# Patient Record
Sex: Female | Born: 1935 | Race: White | Hispanic: No | Smoking: Never smoker
Health system: Southern US, Community
[De-identification: ages and names within clinical notes are randomized; demographics above are authoritative.]

## PROBLEM LIST (undated history)

## (undated) DIAGNOSIS — I82409 Acute embolism and thrombosis of unspecified deep veins of unspecified lower extremity: Secondary | ICD-10-CM

## (undated) DIAGNOSIS — I872 Venous insufficiency (chronic) (peripheral): Secondary | ICD-10-CM

## (undated) DIAGNOSIS — N200 Calculus of kidney: Secondary | ICD-10-CM

## (undated) DIAGNOSIS — M1990 Unspecified osteoarthritis, unspecified site: Secondary | ICD-10-CM

## (undated) DIAGNOSIS — I10 Essential (primary) hypertension: Secondary | ICD-10-CM

## (undated) DIAGNOSIS — E785 Hyperlipidemia, unspecified: Secondary | ICD-10-CM

## (undated) DIAGNOSIS — E669 Obesity, unspecified: Secondary | ICD-10-CM

## (undated) HISTORY — PX: FOOT SURGERY: SHX648

## (undated) HISTORY — DX: Venous insufficiency (chronic) (peripheral): I87.2

## (undated) HISTORY — PX: JOINT REPLACEMENT: SHX530

## (undated) HISTORY — PX: APPENDECTOMY: SHX54

## (undated) HISTORY — DX: Essential (primary) hypertension: I10

## (undated) HISTORY — PX: ABDOMINAL HYSTERECTOMY: SHX81

## (undated) HISTORY — DX: Acute embolism and thrombosis of unspecified deep veins of unspecified lower extremity: I82.409

## (undated) HISTORY — DX: Unspecified osteoarthritis, unspecified site: M19.90

## (undated) HISTORY — PX: VARICOSE VEIN SURGERY: SHX832

## (undated) HISTORY — DX: Hyperlipidemia, unspecified: E78.5

## (undated) HISTORY — DX: Obesity, unspecified: E66.9

---

## 1999-07-12 ENCOUNTER — Encounter: Payer: Self-pay | Admitting: Orthopaedic Surgery

## 1999-07-18 ENCOUNTER — Inpatient Hospital Stay (HOSPITAL_COMMUNITY): Admission: RE | Admit: 1999-07-18 | Discharge: 1999-07-21 | Payer: Self-pay | Admitting: Orthopaedic Surgery

## 1999-07-21 ENCOUNTER — Inpatient Hospital Stay (HOSPITAL_COMMUNITY)
Admission: RE | Admit: 1999-07-21 | Discharge: 1999-07-28 | Payer: Self-pay | Admitting: Physical Medicine & Rehabilitation

## 1999-08-17 ENCOUNTER — Encounter: Admission: RE | Admit: 1999-08-17 | Discharge: 1999-11-15 | Payer: Self-pay | Admitting: Orthopaedic Surgery

## 1999-10-10 ENCOUNTER — Ambulatory Visit (HOSPITAL_BASED_OUTPATIENT_CLINIC_OR_DEPARTMENT_OTHER): Admission: RE | Admit: 1999-10-10 | Discharge: 1999-10-10 | Payer: Self-pay | Admitting: Orthopaedic Surgery

## 1999-11-20 ENCOUNTER — Encounter: Admission: RE | Admit: 1999-11-20 | Discharge: 1999-12-21 | Payer: Self-pay | Admitting: Orthopaedic Surgery

## 2003-06-27 ENCOUNTER — Emergency Department (HOSPITAL_COMMUNITY): Admission: EM | Admit: 2003-06-27 | Discharge: 2003-06-27 | Payer: Self-pay | Admitting: Emergency Medicine

## 2004-01-11 ENCOUNTER — Ambulatory Visit (HOSPITAL_COMMUNITY): Admission: RE | Admit: 2004-01-11 | Discharge: 2004-01-11 | Payer: Self-pay | Admitting: Internal Medicine

## 2004-06-05 ENCOUNTER — Ambulatory Visit: Payer: Self-pay | Admitting: Internal Medicine

## 2004-11-16 ENCOUNTER — Ambulatory Visit: Payer: Self-pay | Admitting: Internal Medicine

## 2005-01-15 ENCOUNTER — Ambulatory Visit: Payer: Self-pay | Admitting: Family Medicine

## 2005-01-17 ENCOUNTER — Ambulatory Visit: Payer: Self-pay

## 2005-03-19 ENCOUNTER — Ambulatory Visit: Payer: Self-pay | Admitting: Internal Medicine

## 2005-03-25 ENCOUNTER — Encounter: Admission: RE | Admit: 2005-03-25 | Discharge: 2005-03-25 | Payer: Self-pay | Admitting: Internal Medicine

## 2005-05-02 ENCOUNTER — Encounter: Admission: RE | Admit: 2005-05-02 | Discharge: 2005-05-02 | Payer: Self-pay | Admitting: Orthopedic Surgery

## 2005-05-02 IMAGING — CR DG MYELOGRAM LUMBAR
3 series · 3 of 3 positions shown · IV contrast (omnipaque)
Comparison: none

CLINICAL DATA: Back and left leg pain.  
LUMBAR MYELOGRAM:
Following informed consent, sterile preparation of the back, and adequate local anesthesia, a lumbar puncture was performed using a 22 gauge spinal needle at 
L2-3 from a left/right paramedian approach.  Fluid was clear and colorless.  15 cc of Omnipaque 180 was instilled in the subarachnoid space.  AP, lateral, and oblique views demonstrate advanced disk space narrowing at L1-2.  Moderate stenosis at L3-4 and L4-5 with effacement of the exiting nerve roots is noted at these levels.  Flexion/extension show no abnormal movement.  There is mild anterolisthesis of L4 forward on L5 which is felt to be degenerative in nature.   Ventral defect at  L3-4 appears to be related to disk material.  
POST MYELOGRAM CT OF THE LUMBAR SPINE:
TECHNIQUE: Multidetector CT imaging of the lumbar spine was performed after intrathecal injection of contrast.  Multiplanar CT image reconstructions were also generated.

[view not recorded (1 of 3)]
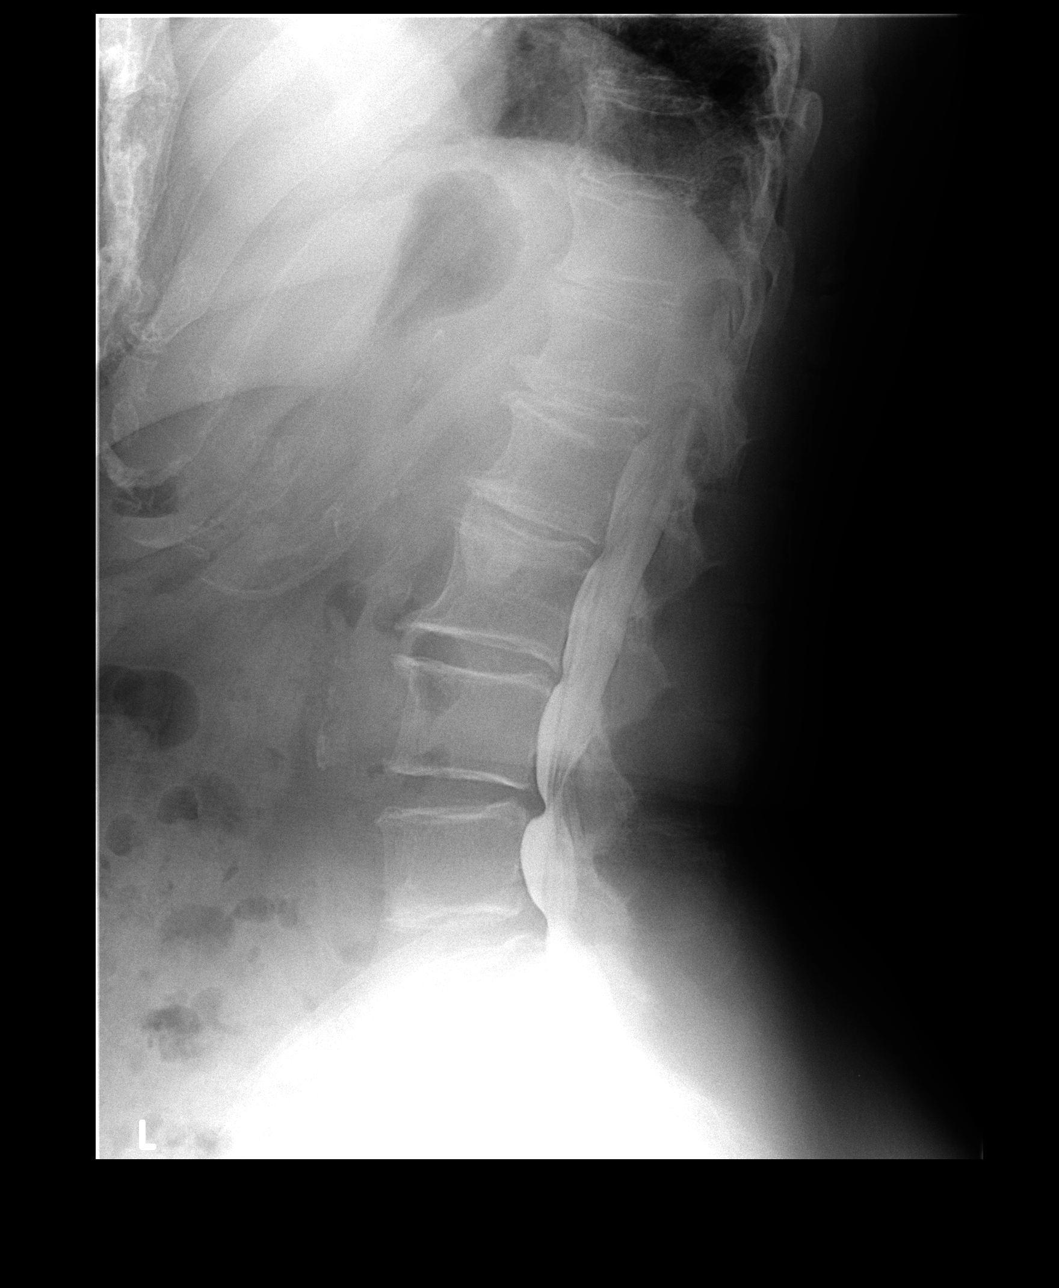

[view not recorded (2 of 3)]
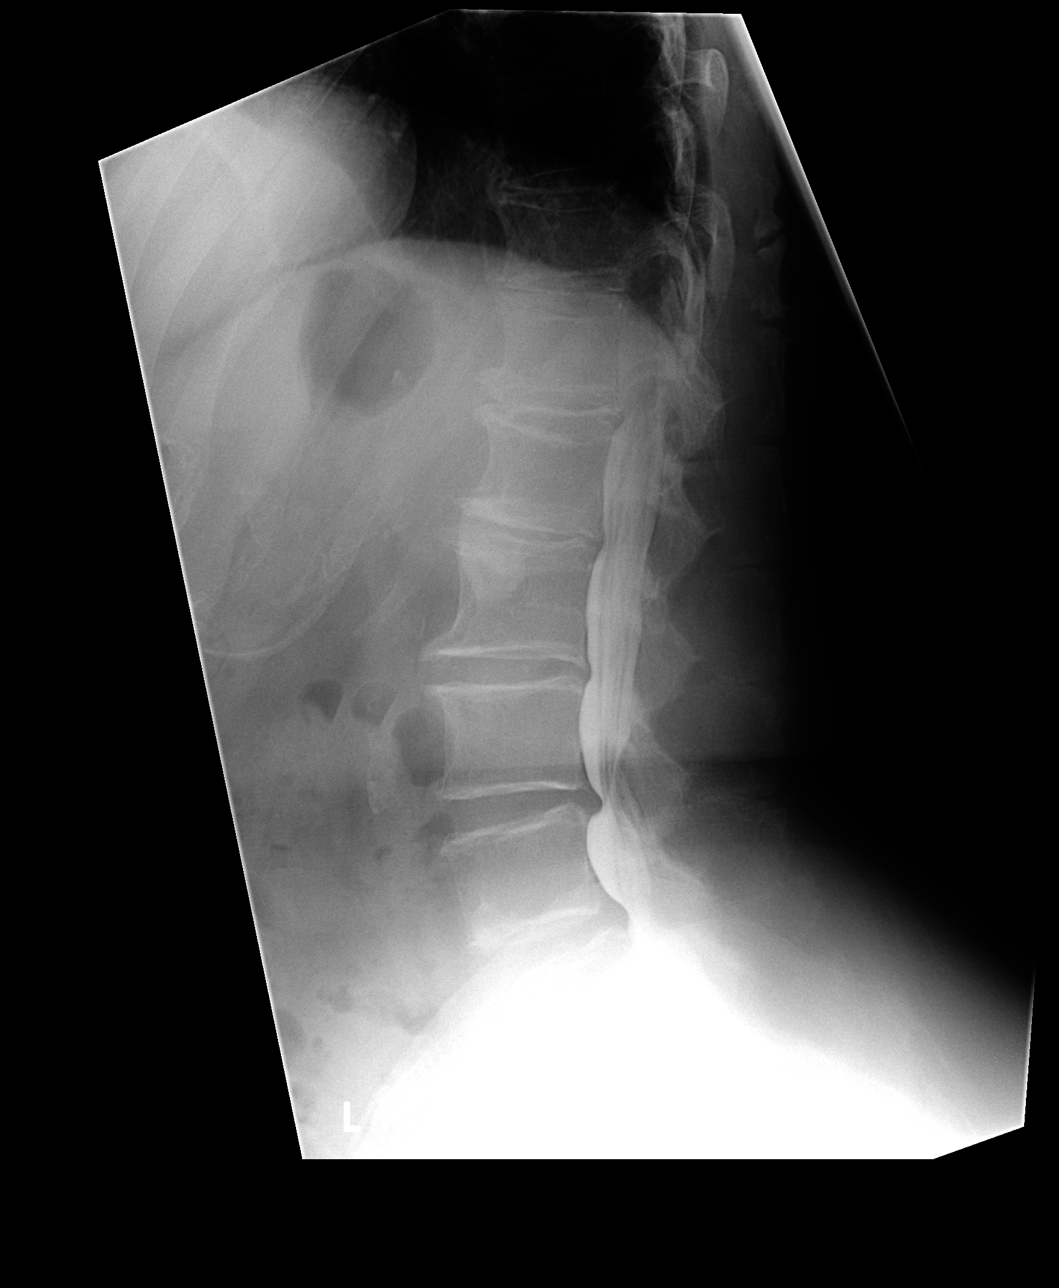

[view not recorded (3 of 3)]
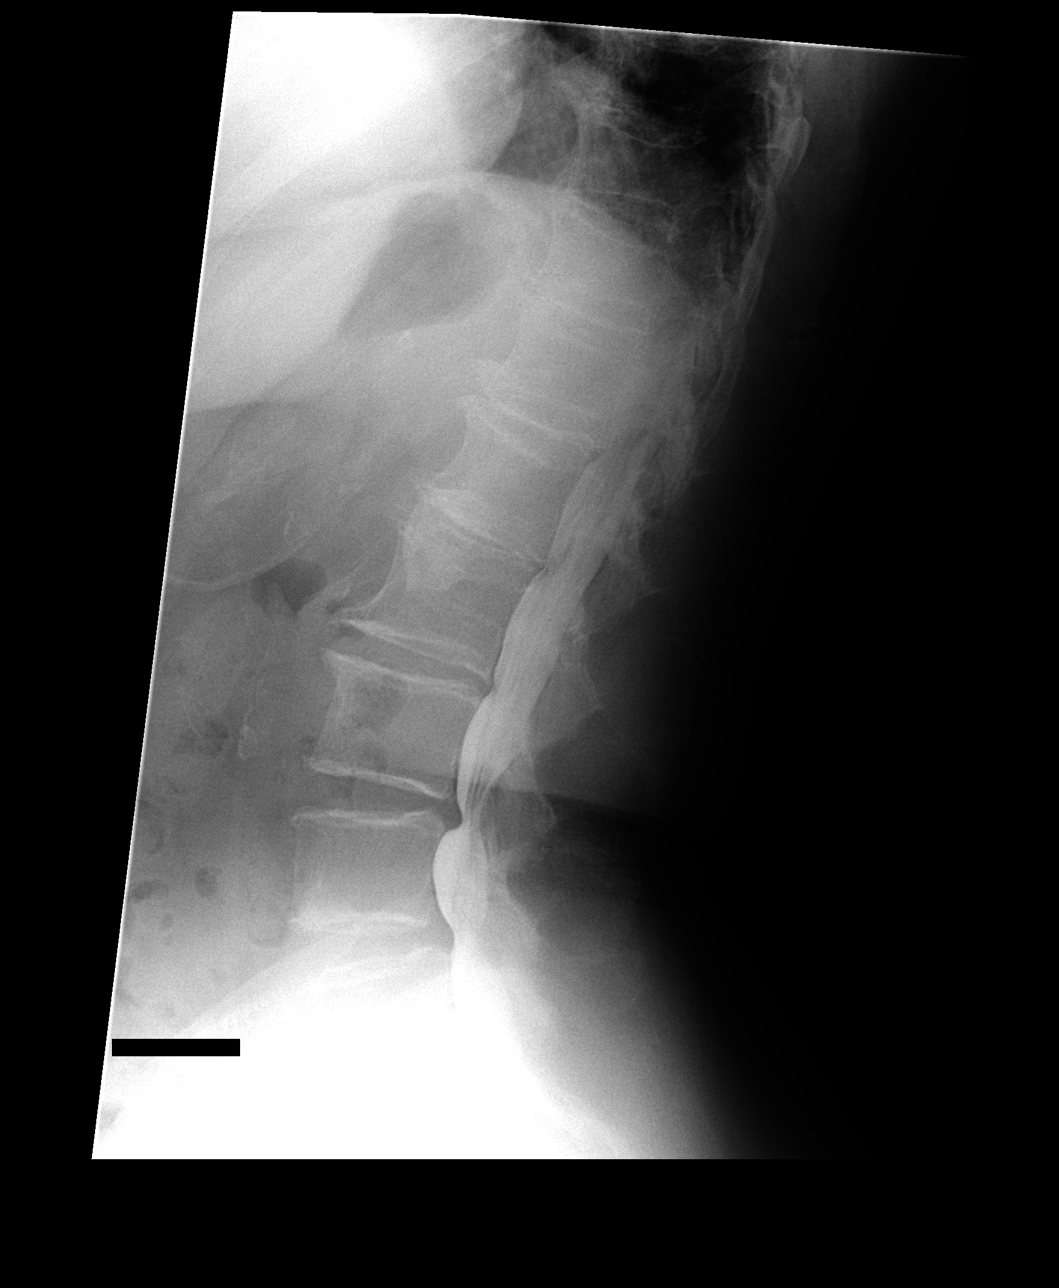

[3 of 3 positions shown; findings below may reference images not displayed]

FINDINGS: L1-2:  Large anterior osteophytes with moderate disk space narrowing.  No stenosis or disk protrusion.
L2-3:  Anterior osteophyte formation.  No stenosis or disk protrusion.
L3-4:  Significant stenosis secondary to a broad-based central protrusion and posterior element hypertrophy.  There is bilateral L4 nerve root encroachment along with compression of the thecal sac.  There is a significant component of disk material in the foramen which is compromising the left L3 root as well. 
L4-5:  Significant spinal stenosis due to posterior element hypertrophy and broad-based central disk protrusion.  Mild degenerative slip noted on plain films has reduced partially on CT.  Bilateral L5 nerve root encroachment is present with compression of the thecal sac.  There is mild left L4 nerve root encroachment in the foramen which is multifactorial due to bony overgrowth, disk material and slip. 
L5-S1: Anterior osteophyte formation.  No stenosis or disk protrusion.  Mild to moderate facet arthropathy particularly on the right. 
Vascular calcification is seen in the aorta, but there is no aneurysmal dilatation.
IMPRESSION: 1.  Significant spinal stenosis at L3-4 secondary to posterior element hypertrophy and a broad-based disk protrusion; bilateral L4 nerve root encroachment is present in the canal, left greater than right along a significant foraminal disk on the left compressing the left L3 nerve root. 
2.  Multifactorial spinal stenosis at L4-5 due to posterior element hypertrophy, broad-based disk protrusion and mild degenerative slip; bilateral L5 nerve root encroachment is seen along with mild left L4 nerve root compromise. 
3.  Mild facet disease L5-S1 without significant nerve root encroachment or spinal stenosis.

## 2005-06-20 ENCOUNTER — Ambulatory Visit: Payer: Self-pay | Admitting: Internal Medicine

## 2005-09-19 ENCOUNTER — Ambulatory Visit: Payer: Self-pay | Admitting: Internal Medicine

## 2005-12-15 ENCOUNTER — Ambulatory Visit: Payer: Self-pay | Admitting: Family Medicine

## 2005-12-24 ENCOUNTER — Ambulatory Visit: Payer: Self-pay | Admitting: Internal Medicine

## 2005-12-31 ENCOUNTER — Encounter: Payer: Self-pay | Admitting: Internal Medicine

## 2006-04-01 ENCOUNTER — Encounter: Payer: Self-pay | Admitting: Internal Medicine

## 2006-04-02 ENCOUNTER — Ambulatory Visit: Payer: Self-pay | Admitting: Internal Medicine

## 2006-04-02 LAB — CONVERTED CEMR LAB
ALT: 18 units/L (ref 0–40)
AST: 25 units/L (ref 0–37)
Albumin: 4.3 g/dL (ref 3.5–5.2)
Alkaline Phosphatase: 75 units/L (ref 39–117)
BUN: 10 mg/dL (ref 6–23)
Basophils Absolute: 0 10*3/uL (ref 0.0–0.1)
Basophils Relative: 0.7 % (ref 0.0–1.0)
Bilirubin, Direct: 0.2 mg/dL (ref 0.0–0.3)
CO2: 27 meq/L (ref 19–32)
Calcium: 9.4 mg/dL (ref 8.4–10.5)
Chloride: 101 meq/L (ref 96–112)
Chol/HDL Ratio, serum: 4.6
Cholesterol: 272 mg/dL (ref 0–200)
Creatinine, Ser: 0.9 mg/dL (ref 0.4–1.2)
Eosinophil percent: 3.6 % (ref 0.0–5.0)
GFR calc non Af Amer: 64 mL/min
Glomerular Filtration Rate, Af Am: 77 mL/min/{1.73_m2}
Glucose, Bld: 103 mg/dL — ABNORMAL HIGH (ref 70–99)
HCT: 40 % (ref 36.0–46.0)
HDL: 59.1 mg/dL (ref >39.0)
Hemoglobin: 13.5 g/dL (ref 12.0–15.0)
LDL DIRECT: 190.9 mg/dL
Lymphocytes Relative: 25.3 % (ref 12.0–46.0)
MCHC: 33.9 g/dL (ref 30.0–36.0)
MCV: 85.9 fL (ref 78.0–100.0)
Monocytes Absolute: 0.5 10*3/uL (ref 0.2–0.7)
Monocytes Relative: 8 % (ref 3.0–11.0)
Neutro Abs: 4 10*3/uL (ref 1.4–7.7)
Neutrophils Relative %: 62.4 % (ref 43.0–77.0)
Platelets: 286 10*3/uL (ref 150–400)
Potassium: 3.7 meq/L (ref 3.5–5.1)
RBC: 4.65 M/uL (ref 3.87–5.11)
RDW: 13.7 % (ref 11.5–14.6)
Sodium: 141 meq/L (ref 135–145)
TSH: 1.34 microintl units/mL (ref 0.35–5.50)
Total Bilirubin: 0.9 mg/dL (ref 0.3–1.2)
Total Protein: 6.9 g/dL (ref 6.0–8.3)
Triglyceride fasting, serum: 118 mg/dL (ref 0–149)
VLDL: 24 mg/dL (ref 0–40)
WBC: 6.3 10*3/uL (ref 4.5–10.5)

## 2006-09-23 ENCOUNTER — Ambulatory Visit: Payer: Self-pay | Admitting: Internal Medicine

## 2007-02-13 ENCOUNTER — Encounter: Payer: Self-pay | Admitting: Internal Medicine

## 2007-02-13 DIAGNOSIS — I10 Essential (primary) hypertension: Secondary | ICD-10-CM | POA: Insufficient documentation

## 2007-02-13 DIAGNOSIS — E785 Hyperlipidemia, unspecified: Secondary | ICD-10-CM

## 2007-02-13 DIAGNOSIS — M1990 Unspecified osteoarthritis, unspecified site: Secondary | ICD-10-CM

## 2007-02-13 HISTORY — DX: Essential (primary) hypertension: I10

## 2007-02-13 HISTORY — DX: Unspecified osteoarthritis, unspecified site: M19.90

## 2007-02-13 HISTORY — DX: Hyperlipidemia, unspecified: E78.5

## 2007-04-03 ENCOUNTER — Ambulatory Visit: Payer: Self-pay | Admitting: Internal Medicine

## 2007-08-14 ENCOUNTER — Encounter: Payer: Self-pay | Admitting: Internal Medicine

## 2007-09-18 ENCOUNTER — Ambulatory Visit: Payer: Self-pay | Admitting: Internal Medicine

## 2007-09-18 DIAGNOSIS — M65849 Other synovitis and tenosynovitis, unspecified hand: Secondary | ICD-10-CM

## 2007-09-18 DIAGNOSIS — M65839 Other synovitis and tenosynovitis, unspecified forearm: Secondary | ICD-10-CM | POA: Insufficient documentation

## 2007-12-16 ENCOUNTER — Ambulatory Visit: Payer: Self-pay | Admitting: Internal Medicine

## 2008-04-14 ENCOUNTER — Encounter: Payer: Self-pay | Admitting: Internal Medicine

## 2008-04-15 ENCOUNTER — Ambulatory Visit: Payer: Self-pay | Admitting: Internal Medicine

## 2008-04-15 LAB — CONVERTED CEMR LAB
Basophils Absolute: 0 10*3/uL (ref 0.0–0.1)
Bilirubin, Direct: 0.1 mg/dL (ref 0.0–0.3)
Calcium: 9.4 mg/dL (ref 8.4–10.5)
Cholesterol: 245 mg/dL (ref 0–200)
GFR calc Af Amer: 77 mL/min
Glucose, Bld: 105 mg/dL — ABNORMAL HIGH (ref 70–99)
HCT: 39.6 % (ref 36.0–46.0)
Hemoglobin: 13.7 g/dL (ref 12.0–15.0)
MCHC: 34.7 g/dL (ref 30.0–36.0)
Monocytes Absolute: 0.5 10*3/uL (ref 0.1–1.0)
Neutro Abs: 4.1 10*3/uL (ref 1.4–7.7)
RDW: 13.9 % (ref 11.5–14.6)
Sodium: 142 meq/L (ref 135–145)
TSH: 1.69 microintl units/mL (ref 0.35–5.50)
Total Bilirubin: 0.8 mg/dL (ref 0.3–1.2)
Total Protein: 7.2 g/dL (ref 6.0–8.3)

## 2008-09-20 ENCOUNTER — Ambulatory Visit: Payer: Self-pay | Admitting: Internal Medicine

## 2009-04-18 ENCOUNTER — Ambulatory Visit: Payer: Self-pay | Admitting: Internal Medicine

## 2009-08-30 ENCOUNTER — Ambulatory Visit: Payer: Self-pay | Admitting: Internal Medicine

## 2009-09-07 ENCOUNTER — Encounter: Payer: Self-pay | Admitting: Internal Medicine

## 2009-09-12 ENCOUNTER — Encounter: Admission: RE | Admit: 2009-09-12 | Discharge: 2009-09-12 | Payer: Self-pay | Admitting: Orthopaedic Surgery

## 2009-10-17 ENCOUNTER — Ambulatory Visit: Payer: Self-pay | Admitting: Internal Medicine

## 2010-04-18 ENCOUNTER — Encounter: Payer: Self-pay | Admitting: Internal Medicine

## 2010-04-18 ENCOUNTER — Ambulatory Visit: Payer: Self-pay | Admitting: Internal Medicine

## 2010-04-18 LAB — CONVERTED CEMR LAB
ALT: 16 units/L (ref 0–35)
BUN: 12 mg/dL (ref 6–23)
Basophils Absolute: 0 10*3/uL (ref 0.0–0.1)
Bilirubin, Direct: 0.2 mg/dL (ref 0.0–0.3)
Cholesterol: 252 mg/dL — ABNORMAL HIGH (ref 0–200)
Creatinine, Ser: 0.9 mg/dL (ref 0.4–1.2)
Direct LDL: 166 mg/dL
Eosinophils Relative: 3.4 % (ref 0.0–5.0)
GFR calc non Af Amer: 59.85 mL/min (ref >60)
Glucose, Bld: 109 mg/dL — ABNORMAL HIGH (ref 70–99)
HDL: 66 mg/dL (ref >39.00)
Monocytes Absolute: 0.7 10*3/uL (ref 0.1–1.0)
Monocytes Relative: 10.6 % (ref 3.0–12.0)
Neutrophils Relative %: 61.4 % (ref 43.0–77.0)
Platelets: 229 10*3/uL (ref 150.0–400.0)
RDW: 16.1 % — ABNORMAL HIGH (ref 11.5–14.6)
TSH: 1.24 microintl units/mL (ref 0.35–5.50)
Total Bilirubin: 1.2 mg/dL (ref 0.3–1.2)
Triglycerides: 128 mg/dL (ref 0.0–149.0)
VLDL: 25.6 mg/dL (ref 0.0–40.0)
WBC: 6.9 10*3/uL (ref 4.5–10.5)

## 2010-07-16 LAB — CONVERTED CEMR LAB
ALT: 17 units/L (ref 0–35)
AST: 24 units/L (ref 0–37)
Albumin: 4.2 g/dL (ref 3.5–5.2)
Alkaline Phosphatase: 75 units/L (ref 39–117)
BUN: 10 mg/dL (ref 6–23)
BUN: 9 mg/dL (ref 6–23)
Basophils Absolute: 0 10*3/uL (ref 0.0–0.1)
Basophils Relative: 0.5 % (ref 0.0–1.0)
Bilirubin, Direct: 0.1 mg/dL (ref 0.0–0.3)
Bilirubin, Direct: 0.3 mg/dL (ref 0.0–0.3)
CO2: 29 meq/L (ref 19–32)
Calcium: 9.4 mg/dL (ref 8.4–10.5)
Chloride: 108 meq/L (ref 96–112)
Chloride: 99 meq/L (ref 96–112)
Cholesterol: 236 mg/dL — ABNORMAL HIGH (ref 0–200)
Cholesterol: 260 mg/dL (ref 0–200)
Creatinine, Ser: 0.9 mg/dL (ref 0.4–1.2)
Creatinine, Ser: 0.9 mg/dL (ref 0.4–1.2)
Direct LDL: 171.3 mg/dL
Direct LDL: 179.2 mg/dL
Eosinophils Absolute: 0.3 10*3/uL (ref 0.0–0.7)
Eosinophils Relative: 4.5 % (ref 0.0–5.0)
Eosinophils Relative: 4.7 % (ref 0.0–5.0)
GFR calc Af Amer: 77 mL/min
GFR calc non Af Amer: 63 mL/min
Glucose, Bld: 106 mg/dL — ABNORMAL HIGH (ref 70–99)
HCT: 39 % (ref 36.0–46.0)
HDL: 57 mg/dL (ref >39.0)
Hemoglobin: 13.4 g/dL (ref 12.0–15.0)
Lymphocytes Relative: 27.1 % (ref 12.0–46.0)
MCHC: 34.5 g/dL (ref 30.0–36.0)
MCHC: 34.8 g/dL (ref 30.0–36.0)
MCV: 87.6 fL (ref 78.0–100.0)
MCV: 93.5 fL (ref 78.0–100.0)
Monocytes Absolute: 0.5 10*3/uL (ref 0.1–1.0)
Monocytes Relative: 6.9 % (ref 3.0–11.0)
Neutrophils Relative %: 60.8 % (ref 43.0–77.0)
Neutrophils Relative %: 69.8 % (ref 43.0–77.0)
Platelets: 222 10*3/uL (ref 150.0–400.0)
Platelets: 234 10*3/uL (ref 150–400)
Potassium: 4 meq/L (ref 3.5–5.1)
RBC: 4.45 M/uL (ref 3.87–5.11)
RDW: 14.1 % (ref 11.5–14.6)
RDW: 14.9 % — ABNORMAL HIGH (ref 11.5–14.6)
Sodium: 143 meq/L (ref 135–145)
Total Bilirubin: 1 mg/dL (ref 0.3–1.2)
Total Bilirubin: 1.2 mg/dL (ref 0.3–1.2)
Total CHOL/HDL Ratio: 4.6
Total Protein: 6.6 g/dL (ref 6.0–8.3)
Triglycerides: 127 mg/dL (ref 0–149)
VLDL: 20 mg/dL (ref 0.0–40.0)
VLDL: 25 mg/dL (ref 0–40)
WBC: 6.2 10*3/uL (ref 4.5–10.5)
WBC: 7 10*3/uL (ref 4.5–10.5)

## 2010-07-20 NOTE — Assessment & Plan Note (Signed)
Summary: CPX//SLM   Vital Signs:  Patient profile:   77 year old female Height:      63 inches Weight:      192 pounds Temp:     97.5 degrees F oral BP sitting:   180 / 86  (left arm) Cuff size:   regular  Vitals Entered By: Sid Falcon LPN (April 18, 2010 8:56 AM)  History of Present Illness: 77 year old patient who is seen today for a wellness exam.  She has a history of osteoarthritis and chronic low back pain.  She benefited greatly by some epidurals earlier this year.  She has treated hypertension and mild dyslipidemia.  She denies any cardiopulmonary complaints.  Her back pain is dramatically improved and she feels she does quite well at 89.  Here for Medicare AWV:  1.   Risk factors based on Past M, S, F history:   cardiovascular risk factors include age, hypertension, and dyslipidemia 2.   Physical Activities:  fairly sedentary due to age and arthritis 3.   Depression/mood: no history of depression or mood disorder 4.   Hearing: moderately impaired.  Uses hearing aids bilaterally 5.   ADL's: independent in all aspects of daily living 6.   Fall Risk: moderate due to age and arthritis 7.   Home Safety: no proms identifying 8.   Height, weight, &visual acuity:height weight, and visual acuity, stable.  Did have a recent eye exam yesterday 9.   Counseling: heart healthy diet and restricted salt diet discussed and encouraged 10.   Labs ordered based on risk factors: laboratory  profile reviewed 11.           Referral Coordination- not appropriate at this time.  Follow up orthopedic as needed 12.           Care Plan- laboratory panel be reviewed.  Calcium and vitamin D supplementation.  Encouraged 13.            Cognitive Assessment- alert and oriented, with normal affect.  No history of memory dysfunction.  Continues to handle all executive functions without difficulty or assistance   Allergies: 1)  * Flu Vaccine 2)  Amoxicillin (Amoxicillin) 3)  Carbamazepine  (Carbamazepine) 4)  Keflex (Cephalexin)  Past History:  Past Medical History: Reviewed history from 04/15/2008 and no changes required. Hypertension Osteoarthritis Chronic venous insufficiency Hyperlipidemia exogenous obesity remote history of DVT  Past Surgical History: Appendectomy Hysterectomy  80 Vein stripping  52   Foot surgery  89 status post left total knee replacement 2001   flexible sigmoidoscopy in 1990  Family History: Reviewed history from 04/15/2008 and no changes required. at age 43, coronary artery disease mother died at age 11 3 brothers, all deceased, positive for CAD, COPD, renal failure two sisters, both deceased  renal failure, CAD, one  with polycystic kidney disease  Social History: Reviewed history from 04/15/2008 and no changes required. Widow/Widower Never Smoked  Review of Systems       The patient complains of difficulty walking.  The patient denies anorexia, fever, weight loss, weight gain, vision loss, decreased hearing, hoarseness, chest pain, syncope, dyspnea on exertion, peripheral edema, prolonged cough, headaches, hemoptysis, abdominal pain, melena, hematochezia, severe indigestion/heartburn, hematuria, incontinence, genital sores, muscle weakness, suspicious skin lesions, transient blindness, depression, unusual weight change, abnormal bleeding, enlarged lymph nodes, angioedema, and breast masses.    Physical Exam  General:  overweight-appearing.  150/64overweight-appearing.   Head:  Normocephalic and atraumatic without obvious abnormalities. No apparent alopecia or balding. Eyes:  No corneal or conjunctival inflammation noted. EOMI. Perrla. Funduscopic exam benign, without hemorrhages, exudates or papilledema. Vision grossly normal. Ears:  External ear exam shows no significant lesions or deformities.  Otoscopic examination reveals clear canals, tympanic membranes are intact bilaterally without bulging, retraction, inflammation or  discharge. Hearing is grossly normal bilaterally. Nose:  External nasal examination shows no deformity or inflammation. Nasal mucosa are pink and moist without lesions or exudates. Mouth:  Oral mucosa and oropharynx without lesions or exudates.  Teeth in good repair. Neck:  No deformities, masses, or tenderness noted. Chest Wall:  No deformities, masses, or tenderness noted. Breasts:  No mass, nodules, thickening, tenderness, bulging, retraction, inflamation, nipple discharge or skin changes noted.   Lungs:  Normal respiratory effort, chest expands symmetrically. Lungs are clear to auscultation, no crackles or wheezes. Heart:  Normal rate and regular rhythm. S1 and S2 normal without gallop, murmur, click, rub or other extra sounds. Abdomen:  Bowel sounds positive,abdomen soft and non-tender without masses, organomegaly or hernias noted. Rectal:  No external abnormalities noted. Normal sphincter tone. No rectal masses or tenderness. Msk:  No deformity or scoliosis noted of thoracic or lumbar spine.   Pulses:  posterior tibial pulses intact Extremities:  No clubbing, cyanosis, edema, or deformity noted with normal full range of motion of all joints.   Neurologic:  No cranial nerve deficits noted. Station and gait are normal. Plantar reflexes are down-going bilaterally. DTRs are symmetrical throughout. Sensory, motor and coordinative functions appear intact. Skin:  Intact without suspicious lesions or rashes Cervical Nodes:  No lymphadenopathy noted Axillary Nodes:  No palpable lymphadenopathy Inguinal Nodes:  No significant adenopathy Psych:  Cognition and judgment appear intact. Alert and cooperative with normal attention span and concentration. No apparent delusions, illusions, hallucinations   Impression & Recommendations:  Problem # 1:  PREVENTIVE HEALTH CARE (ICD-V70.0)  Orders: Medicare -1st Annual Wellness Visit [id number])  Complete Medication List: 1)  Hydrochlorothiazide 25 Mg Tabs  (Hydrochlorothiazide) .... Take 1 tablet by mouth every morning 2)  Hydrocodone-acetaminophen 5-500 Mg Tabs (Hydrocodone-acetaminophen) .... One every 6 hours for pain 3)  Felodipine 10 Mg Xr24h-tab (Felodipine) .Marland Kitchen.. 1 once daily  Other Orders: Venipuncture (60454) TLB-Lipid Panel (80061-LIPID) TLB-BMP (Basic Metabolic Panel-BMET) (80048-METABOL) TLB-CBC Platelet - w/Differential (85025-CBCD) TLB-Hepatic/Liver Function Pnl (80076-HEPATIC) TLB-TSH (Thyroid Stimulating Hormone) (84443-TSH) Specimen Handling (09811)  Patient Instructions: 1)  Please schedule a follow-up appointment in 6 months. 2)  Limit your Sodium (Salt). 3)  It is important that you exercise regularly at least 20 minutes 5 times a week. If you develop chest pain, have severe difficulty breathing, or feel very tired , stop exercising immediately and seek medical attention. 4)  Take calcium +Vitamin D daily. Prescriptions: FELODIPINE 10 MG XR24H-TAB (FELODIPINE) 1 once daily  #90 Tablet x 3   Entered and Authorized by:   Gordy Savers  MD   Signed by:   Gordy Savers  MD on 04/18/2010   Method used:   Print then Give to Patient   RxID:   [id number] HYDROCODONE-ACETAMINOPHEN 5-500 MG  TABS (HYDROCODONE-ACETAMINOPHEN) one every 6 hours for pain  #90 x 3   Entered and Authorized by:   Gordy Savers  MD   Signed by:   Gordy Savers  MD on 04/18/2010   Method used:   Print then Give to Patient   RxID:   [id number] HYDROCHLOROTHIAZIDE 25 MG TABS (HYDROCHLOROTHIAZIDE) Take 1 tablet by mouth every morning  #90 Tablet x 3   Entered  and Authorized by:   Gordy Savers  MD   Signed by:   Gordy Savers  MD on 04/18/2010   Method used:   Print then Give to Patient   RxID:   [id number]    Orders Added: 1)  Medicare -1st Annual Wellness Visit [G0438] 2)  Est. Patient Level III [14782] 3)  Venipuncture [95621] 4)  TLB-Lipid Panel [80061-LIPID] 5)  TLB-BMP (Basic Metabolic  Panel-BMET) [80048-METABOL] 6)  TLB-CBC Platelet - w/Differential [85025-CBCD] 7)  TLB-Hepatic/Liver Function Pnl [80076-HEPATIC] 8)  TLB-TSH (Thyroid Stimulating Hormone) [84443-TSH] 9)  Specimen Handling [99000]

## 2010-07-20 NOTE — Assessment & Plan Note (Signed)
Summary: 6 MONTH ROV/NJR   Vital Signs:  Patient profile:   77 year old female Weight:      187 pounds Temp:     97.8 degrees F oral BP sitting:   130 / 70  (right arm) Cuff size:   regular  Vitals Entered By: Duard Brady LPN (Oct 17, 1608 8:17 AM) CC: 6 mos rov - doing well Is Patient Diabetic? No   CC:  6 mos rov - doing well.  History of Present Illness: 77 year old patient who is seen today for follow-up of her hypertension.  She is followed by orthopedic surgery for arthritis she is doing fairly well and has improved with some local injection therapy.  She has a history of mild dyslipidemia.  She denies any cardiopulmonary complaints.  She has hypertension, which is controlled on dual therapy, which she continues to tolerate well.  She denies any cardiopulmonary complaints.  Preventive Screening-Counseling & Management  Alcohol-Tobacco     Smoking Status: never  Allergies: 1)  * Flu Vaccine 2)  Amoxicillin (Amoxicillin) 3)  Carbamazepine (Carbamazepine) 4)  Keflex (Cephalexin)  Past History:  Past Medical History: Reviewed history from 04/15/2008 and no changes required. Hypertension Osteoarthritis Chronic venous insufficiency Hyperlipidemia exogenous obesity remote history of DVT  Past Surgical History: Reviewed history from 08/30/2009 and no changes required. Appendectomy Hysterectomy  80 Vein stripping  52   Foot surgery  89 status post left total knee replacement 2001 (  flexible sigmoidoscopy in 1990  Review of Systems       The patient complains of difficulty walking.  The patient denies anorexia, fever, weight loss, weight gain, vision loss, decreased hearing, hoarseness, chest pain, syncope, dyspnea on exertion, peripheral edema, prolonged cough, headaches, hemoptysis, abdominal pain, melena, hematochezia, severe indigestion/heartburn, hematuria, incontinence, genital sores, muscle weakness, suspicious skin lesions, transient blindness,  depression, unusual weight change, abnormal bleeding, enlarged lymph nodes, angioedema, and breast masses.    Physical Exam  General:  overweight-appearing.  130/78 Head:  Normocephalic and atraumatic without obvious abnormalities. No apparent alopecia or balding. Eyes:  No corneal or conjunctival inflammation noted. EOMI. Perrla. Funduscopic exam benign, without hemorrhages, exudates or papilledema. Vision grossly normal. Mouth:  Oral mucosa and oropharynx without lesions or exudates.  Teeth in good repair. Neck:  No deformities, masses, or tenderness noted. Lungs:  Normal respiratory effort, chest expands symmetrically. Lungs are clear to auscultation, no crackles or wheezes. Heart:  Normal rate and regular rhythm. S1 and S2 normal without gallop, murmur, click, rub or other extra sounds. Abdomen:  Bowel sounds positive,abdomen soft and non-tender without masses, organomegaly or hernias noted.   Impression & Recommendations:  Problem # 1:  OSTEOARTHRITIS (ICD-715.90)  Her updated medication list for this problem includes:    Hydrocodone-acetaminophen 5-500 Mg Tabs (Hydrocodone-acetaminophen) ..... One every 6 hours for pain  Her updated medication list for this problem includes:    Hydrocodone-acetaminophen 5-500 Mg Tabs (Hydrocodone-acetaminophen) ..... One every 6 hours for pain  Problem # 2:  HYPERTENSION (ICD-401.9)  Her updated medication list for this problem includes:    Hydrochlorothiazide 25 Mg Tabs (Hydrochlorothiazide) .Marland Kitchen... Take 1 tablet by mouth every morning    Felodipine 10 Mg Xr24h-tab (Felodipine) .Marland Kitchen... 1 once daily  Her updated medication list for this problem includes:    Hydrochlorothiazide 25 Mg Tabs (Hydrochlorothiazide) .Marland Kitchen... Take 1 tablet by mouth every morning    Felodipine 10 Mg Xr24h-tab (Felodipine) .Marland Kitchen... 1 once daily  Complete Medication List: 1)  Hydrochlorothiazide 25 Mg Tabs (  Hydrochlorothiazide) .... Take 1 tablet by mouth every morning 2)   Hydrocodone-acetaminophen 5-500 Mg Tabs (Hydrocodone-acetaminophen) .... One every 6 hours for pain 3)  Felodipine 10 Mg Xr24h-tab (Felodipine) .Marland Kitchen.. 1 once daily  Patient Instructions: 1)  Please schedule a follow-up appointment in 6 months. 2)  Limit your Sodium (Salt) to less than 2 grams a day(slightly less than 1/2 a teaspoon) to prevent fluid retention, swelling, or worsening of symptoms. 3)  It is important that you exercise regularly at least 20 minutes 5 times a week. If you develop chest pain, have severe difficulty breathing, or feel very tired , stop exercising immediately and seek medical attention. 4)  Take calcium +Vitamin D daily.

## 2010-07-20 NOTE — Consult Note (Signed)
Summary: [facility]  [facility]   Imported By: Maryln Gottron 09/16/2009 15:23:08  _____________________________________________________________________  External Attachment:    Type:   Image     Comment:   External Document

## 2010-07-20 NOTE — Assessment & Plan Note (Signed)
Summary: pain in hip/difficulty walking/cjr   Vital Signs:  Patient profile:   77 year old female Weight:      190 pounds Temp:     97.9 degrees F oral BP sitting:   132 / 70  (right arm) Cuff size:   regular  Vitals Entered By: Duard Brady LPN (August 30, 2009 12:54 PM) CC: c/o right hip pain, no injury/fall Is Patient Diabetic? No   CC:  c/o right hip pain and no injury/fall.  History of Present Illness: an 77 year old patient has a history of osteoarthritis.  She is  status post the knee replacement surgery in 2001.  She has had some chronic right hip pain that has intensified recently.  There is been no history of trauma.  She has been using pain medication on a regular basis and is having much more difficult time with ambulating.  She has treated hypertension, which has been stable  Preventive Screening-Counseling & Management  Alcohol-Tobacco     Smoking Status: never  Allergies: 1)  * Flu Vaccine 2)  Amoxicillin (Amoxicillin) 3)  Carbamazepine (Carbamazepine) 4)  Keflex (Cephalexin)  Past History:  Past Medical History: Reviewed history from 04/15/2008 and no changes required. Hypertension Osteoarthritis Chronic venous insufficiency Hyperlipidemia exogenous obesity remote history of DVT  Past Surgical History: Appendectomy Hysterectomy  80 Vein stripping  52   Foot surgery  89 status post left total knee replacement 2001 (  flexible sigmoidoscopy in 1990  Review of Systems       The patient complains of difficulty walking.  The patient denies anorexia, fever, weight loss, weight gain, vision loss, decreased hearing, hoarseness, chest pain, syncope, dyspnea on exertion, peripheral edema, prolonged cough, headaches, hemoptysis, abdominal pain, melena, hematochezia, severe indigestion/heartburn, hematuria, incontinence, genital sores, muscle weakness, suspicious skin lesions, depression, unusual weight change, abnormal bleeding, enlarged lymph nodes,  angioedema, and breast masses.    Physical Exam  General:  overweight-appearing.  blood pressure 130/74 Head:  Normocephalic and atraumatic without obvious abnormalities. No apparent alopecia or balding. Eyes:  No corneal or conjunctival inflammation noted. EOMI. Perrla. Funduscopic exam benign, without hemorrhages, exudates or papilledema. Vision grossly normal. Mouth:  Oral mucosa and oropharynx without lesions or exudates.  Teeth in good repair. Neck:  No deformities, masses, or tenderness noted. Lungs:  Normal respiratory effort, chest expands symmetrically. Lungs are clear to auscultation, no crackles or wheezes. Heart:  Normal rate and regular rhythm. S1 and S2 normal without gallop, murmur, click, rub or other extra sounds. Abdomen:  Bowel sounds positive,abdomen soft and non-tender without masses, organomegaly or hernias noted. Msk:  No deformity or scoliosis noted of thoracic or lumbar spine.  painful range of motion right hip Extremities:  No clubbing, cyanosis, edema, or deformity noted with normal full range of motion of all joints.     Impression & Recommendations:  Problem # 1:  OSTEOARTHRITIS (ICD-715.90)  Her updated medication list for this problem includes:    Hydrocodone-acetaminophen 5-500 Mg Tabs (Hydrocodone-acetaminophen) ..... One every 6 hours for pain symptomatic right hip pain.  Will schedule orthopedic referral  Her updated medication list for this problem includes:    Hydrocodone-acetaminophen 5-500 Mg Tabs (Hydrocodone-acetaminophen) ..... One every 6 hours for pain  Problem # 2:  HYPERTENSION (ICD-401.9)  Her updated medication list for this problem includes:    Hydrochlorothiazide 25 Mg Tabs (Hydrochlorothiazide) .Marland Kitchen... Take 1 tablet by mouth every morning    Felodipine 10 Mg Xr24h-tab (Felodipine) .Marland Kitchen... 1 once daily  Her updated  medication list for this problem includes:    Hydrochlorothiazide 25 Mg Tabs (Hydrochlorothiazide) .Marland Kitchen... Take 1 tablet by  mouth every morning    Felodipine 10 Mg Xr24h-tab (Felodipine) .Marland Kitchen... 1 once daily  Complete Medication List: 1)  Hydrochlorothiazide 25 Mg Tabs (Hydrochlorothiazide) .... Take 1 tablet by mouth every morning 2)  Hydrocodone-acetaminophen 5-500 Mg Tabs (Hydrocodone-acetaminophen) .... One every 6 hours for pain 3)  Felodipine 10 Mg Xr24h-tab (Felodipine) .Marland Kitchen.. 1 once daily  Other Orders: Orthopedic Surgeon Referral (Ortho Surgeon)  Patient Instructions: 1)  orthopedic follow-up as scheduled 2)  Limit your Sodium (Salt) to less than 2 grams a day(slightly less than 1/2 a teaspoon) to prevent fluid retention, swelling, or worsening of symptoms. 3)  It is important that you exercise regularly at least 20 minutes 5 times a week. If you develop chest pain, have severe difficulty breathing, or feel very tired , stop exercising immediately and seek medical attention. 4)  You need to lose weight. Consider a lower calorie diet and regular exercise.  5)  Please schedule a follow-up appointment in 3 months. Prescriptions: FELODIPINE 10 MG XR24H-TAB (FELODIPINE) 1 once daily  #90 x 6   Entered and Authorized by:   Gordy Savers  MD   Signed by:   Gordy Savers  MD on 08/30/2009   Method used:   Print then Give to Patient   RxID:   [id number] HYDROCODONE-ACETAMINOPHEN 5-500 MG  TABS (HYDROCODONE-ACETAMINOPHEN) one every 6 hours for pain  #90 x 3   Entered and Authorized by:   Gordy Savers  MD   Signed by:   Gordy Savers  MD on 08/30/2009   Method used:   Print then Give to Patient   RxID:   [id number] HYDROCHLOROTHIAZIDE 25 MG TABS (HYDROCHLOROTHIAZIDE) Take 1 tablet by mouth every morning  #90 Tablet x 6   Entered and Authorized by:   Gordy Savers  MD   Signed by:   Gordy Savers  MD on 08/30/2009   Method used:   Print then Give to Patient   RxID:   [id number]

## 2010-10-20 ENCOUNTER — Encounter: Payer: Self-pay | Admitting: Internal Medicine

## 2010-10-24 ENCOUNTER — Encounter: Payer: Self-pay | Admitting: Internal Medicine

## 2010-10-25 ENCOUNTER — Ambulatory Visit (INDEPENDENT_AMBULATORY_CARE_PROVIDER_SITE_OTHER): Payer: Medicare Other | Admitting: Internal Medicine

## 2010-10-25 ENCOUNTER — Encounter: Payer: Self-pay | Admitting: Internal Medicine

## 2010-10-25 DIAGNOSIS — E785 Hyperlipidemia, unspecified: Secondary | ICD-10-CM

## 2010-10-25 DIAGNOSIS — M1990 Unspecified osteoarthritis, unspecified site: Secondary | ICD-10-CM

## 2010-10-25 DIAGNOSIS — I10 Essential (primary) hypertension: Secondary | ICD-10-CM

## 2010-10-25 MED ORDER — HYDROCODONE-ACETAMINOPHEN 5-500 MG PO TABS: 1.0000 | ORAL_TABLET | Freq: Four times a day (QID) | ORAL | Status: DC | PRN

## 2010-10-25 MED ORDER — FELODIPINE ER 10 MG PO TB24: 10.0000 mg | ORAL_TABLET | Freq: Every day | ORAL | Status: DC

## 2010-10-25 MED ORDER — TRIAMCINOLONE ACETONIDE 0.1 % EX CREA: TOPICAL_CREAM | Freq: Two times a day (BID) | CUTANEOUS | Status: AC

## 2010-10-25 MED ORDER — HYDROCHLOROTHIAZIDE 25 MG PO TABS: 25.0000 mg | ORAL_TABLET | Freq: Every day | ORAL | Status: DC

## 2010-10-25 NOTE — Progress Notes (Signed)
  Subjective:    Patient ID: Mckenzie Jordan, female    DOB: [birth year], 77 y.o.   MRN: [id number]  HPI 77 year old patient who is seen today for followup of hypertension. She has osteoarthritis which is controlled with the when necessary analgesics. Complaints include a rash involving the medial aspect of her left ankle area it is slightly pruritic. She also has some allergy related complaints that have resolved with avoiding outdoor exposure. In general she does quite well. She states that she will be having a dental procedure done soon and she has always taken clindamycin one hour before her dental procedure. This has been done presumably due to a remote total knee replacement. She was told that antibiotic prophylaxis is not necessary.   Review of Systems  Constitutional: Negative.   HENT: Positive for sneezing and postnasal drip. Negative for hearing loss, congestion, sore throat, rhinorrhea, dental problem, sinus pressure and tinnitus.   Eyes: Negative for pain, discharge and visual disturbance.  Respiratory: Negative for cough and shortness of breath.   Cardiovascular: Negative for chest pain, palpitations and leg swelling.  Gastrointestinal: Negative for nausea, vomiting, abdominal pain, diarrhea, constipation, blood in stool and abdominal distention.  Genitourinary: Negative for dysuria, urgency, frequency, hematuria, flank pain, vaginal bleeding, vaginal discharge, difficulty urinating, vaginal pain and pelvic pain.  Musculoskeletal: Negative for joint swelling, arthralgias and gait problem.  Skin: Positive for rash.  Neurological: Negative for dizziness, syncope, speech difficulty, weakness, numbness and headaches.  Hematological: Negative for adenopathy.  Psychiatric/Behavioral: Negative for behavioral problems, dysphoric mood and agitation. The patient is not nervous/anxious.        Objective:   Physical Exam  Constitutional: She is oriented to person, place, and time. She appears  well-developed and well-nourished.  HENT:  Head: Normocephalic.  Right Ear: External ear normal.  Left Ear: External ear normal.  Mouth/Throat: Oropharynx is clear and moist.  Eyes: Conjunctivae and EOM are normal. Pupils are equal, round, and reactive to light.  Neck: Normal range of motion. Neck supple. No thyromegaly present.  Cardiovascular: Normal rate, regular rhythm, normal heart sounds and intact distal pulses.   Pulmonary/Chest: Effort normal and breath sounds normal.  Abdominal: Soft. Bowel sounds are normal. She exhibits no mass. There is no tenderness.  Musculoskeletal: Normal range of motion.  Lymphadenopathy:    She has no cervical adenopathy.  Neurological: She is alert and oriented to person, place, and time.  Skin: Skin is warm and dry. Rash noted.       A slightly erythematous macular rash noted involving the inner aspect of her left ankle. Mild stasis changes noted  Psychiatric: She has a normal mood and affect. Her behavior is normal.          Assessment & Plan:   Stasis dermatitis. Will treat with a short course of triamcinolone cream Hypertension stable Osteoarthritis. Well controlled

## 2010-10-25 NOTE — Patient Instructions (Signed)
Limit your sodium (Salt) intake  Take a calcium supplement, plus 800-1200 units of vitamin D    It is important that you exercise regularly, at least 20 minutes 3 to 4 times per week.  If you develop chest pain or shortness of breath seek  medical attention.  Return in 6 months for follow-up 

## 2010-11-03 NOTE — Op Note (Signed)
[place]. [facility]  Patient:    Mckenzie Jordan, Melby                      MRN: [id number] Proc. Date: 10/10/99 Adm. Date:  [id number] Attending:  Marcene Corning                           Operative Report  PREOPERATIVE DIAGNOSIS:  Left knee stiffness.  POSTOPERATIVE DIAGNOSIS:  Left knee stiffness.  OPERATION PERFORMED:  Left knee closed manipulation.  ANESTHESIA:  General mask.  ATTENDING SURGEON:  Lubertha Basque. Jerl Santos, M.D.  ASSISTANT:  Lindwood Qua, P.A.  INDICATIONS FOR PROCEDURE:  The patient is a 77 year old woman less than three months from a total knee replacement on the left.  She has persisted with some stiffness despite physical therapy.  At this point she is offered a closed manipulation.  The procedure was discussed with the patient and informed operative consent was obtained after discussion of possible complications of reaction to anesthesia and infection and fracture.  DESCRIPTION OF PROCEDURE:  The patient was taken to an operating suite where general anesthetic was applied by mask.  She was then positioned supine. Passively, her knee went from about 5 to 80 degrees.  With manipulation we increased this to 120 degree flexion and 0 extension with audible pops along the way.  After sterile prep with Betadine and alcohol, we injected the knee with Marcaine. A band-aid was placed over the injection site.  DISPOSITION:  The patient was taken to the recovery room in stable condition. Plans were for her to go home the same day and to follow up in the office in less than a week.  I will contact her by phone tonight. DD:  10/10/99 TD:  10/10/99 Job: [id number]

## 2010-11-03 NOTE — Op Note (Signed)
[place]. [facility]  Patient:    Mckenzie Jordan                       MRN: [id number] Proc. Date: 07/18/99 Adm. Date:  [id number] Attending:  Marcene Corning                           Operative Report  PREOPERATIVE DIAGNOSIS:  Left knee degenerative arthritis.  POSTOPERATIVE DIAGNOSIS:  Left knee degenerative arthritis.  OPERATION PERFORMED:  Left total knee replacement.  ANESTHESIA:  General and postoperative epidural.  ATTENDING SURGEON:  Lubertha Basque. Jerl Santos, M.D.  ASSISTANT: 1. Jearld Adjutant, M.D. 2. Lindwood Qua, P.A.  INDICATIONS FOR PROCEDURE:  The patient is a 76 year old woman with a long history of bilateral knee pain.  This persisted despite anti-inflammatories and injections and at this point she is offered operative intervention to consist of a knee arthroplasty.  The procedure was discussed with the patient and informed operative consent was obtained after discussion of possible complications of reaction to anesthesia, infection, deep vein thrombosis, pulmonary embolus and death.  DESCRIPTION OF PROCEDURE:  The patient was taken to an operating suite where general anesthetic was applied without difficulty.  She was then positioned supine and prepped and draped in normal sterile fashion.  After administration of preop intravenous antibiotics, the left leg was elevated, exsanguinated, and tourniquet inflated about the thigh.  An anterior longitudinal incision was made with dissection down to the extensor mechanism.  A medial parapatellar incision was ade in this structure and the knee cap was flipped and the knee flexed.  There was severe degenerative arthritis in all three compartments with an intact ACL and CL. She had some residual meniscal tissues in the lateral compartment but most of the medial meniscus was gone.  A tibial guide was used to make a tibial cut parallel with the floor taking about 7 or 8 mm of bone.  An  intramedullary femoral guide was then placed to make anterior and posterior cuts on the femur to create a 10 degree flexion gap.  An extension guide was then placed to make a distal femoral cut to make an equal 10 degree extension gap.  Ligamentous structures were already well balanced and no significant soft tissue work was required.  The Chamfer cutting  guide was applied to the femur which sized to a standard plus component.  This guide was used.  The tibial also sized to a standard plus component and the appropriate guide was placed and used.  The patella also sized to the standard lus component and after some of this was removed with an appropriate guide, the standard plus guide was used to create the three drill holes necessary for fixation of the patellar component.  All trial components were then placed along with the 10 mm deep dish spacer.  The knee came to full extension which was different from her preoperative status.  She had good stability in extension and inflexion which was past 100 degrees.  The knee cap tracked well and no lateral release was required.  All trial components were removed and the bony surfaces were thoroughly irrigated with pulsatile lavage.  Cement was mixed with antibiotic.  Cement was  applied to all bony surfaces and the standard plus components from the Depuy LCS system were applied to all three bony surfaces.  Pressure was held on the components until the cement had hardened.  Excess cement was trimmed during this process.  The 10 mm deep dish spacer was placed and the knee was stable in extension and flexed past 100 degrees with a well tracking patella.  The tourniquet was deflated and the leg became pink and warm immediately.  A small amount of bleeding was easily controlled with Bovie cautery.  The knee was thoroughly irrigated followed by closure of the extensor mechanism with #1 Vicryl in interrupted fashion.  Subcutaneous tissues  were reapproximated with 2-0 and 0 Vicryl in interrupted fashion and the skin was closed with staples.  A drain was placed prior to closure.  Adaptic was placed on the wound followed by dry gauze and a loose Ace wrap.  Estimated blood loss and intraoperative fluids can be obtained from Anesthesia records as can accurate tourniquet time which was less than an hour.  DISPOSITION:  The patient was rolled on her side and an attempt at a postoperative epidural was made.  She was then taken to the recovery room in stable condition. She will be admitted to the orthopedic surgery service for appropriate postoperative care to include perioperative antibiotics and deep vein thrombosis prophylaxis with Coumadin.  We will mobilize her out of bed as soon as possible. DD:  07/18/99 TD:  07/18/99 Job: [id number]

## 2010-11-03 NOTE — Procedures (Signed)
[place]. [facility]  Patient:    Mckenzie Jordan                       MRN: [id number] Proc. Date: 07/18/99 Adm. Date:  [id number] Attending:  Marcene Corning CC:         Lubertha Basque. Jerl Santos, M.D.             Judie Petit, M.D.                           Procedure Report  PROCEDURES:  Epidural catheter placement.  INDICATIONS:  Mckenzie Jordan is a 77 year old female, who presents to the operating room for left total knee replacement by Dr. Jerl Santos.  Dr. Burna Forts had previous discussed risks and benefits with the patient regarding epidural and consent given.  DESCRIPTION OF PROCEDURE:  The patient was placed in the right lateral decubitus position.  The lumbar spine was sterilely prepared and draped with Betadine.  A 17 gauge Tuohy needle was used with loss of resistance technique at the L3-4 interspace.  There was no heme or CSF aspiration.  Test dose was negative with 3 cc of 1.5% Xylocaine with epinephrine.  The catheter was inserted 3 cm without difficulty.  The needle was withdrawn without difficulty.  The catheter was secured to the patients back with tape.  The patient was returned to the supine position and awakened after her total knee replacement.  The patient will be placed on a  Marcaine, fentanyl infusion and followed by the anesthesia staff. DD:  07/18/99 TD:  07/19/99 Job: [id number]

## 2010-11-03 NOTE — Discharge Summary (Signed)
[place]. [facility]  Patient:    ALLEYKeairra, Bardon                      MRN: [id number] Adm. Date:  [id number] Disc. Date: [id number] Attending:  Faith Rogue T Dictator:   Bynum Bellows. Idacavage, P.A.C. CC:         Gordy Savers, M.D.             Lubertha Basque Jerl Santos, M.D.                           Discharge Summary  DIAGNOSES: 1. Status post left total knee replacement. 2. Hypertension. 3. Coumadin anticoagulation. 4. Enterococcus urinary tract infection.  HISTORY OF PRESENT ILLNESS:  The patient is a 77 year old white female with end-stage DJD of the left knee, who elected to undergo left total knee replacement on July 18, 1999, by Lubertha Basque. Jerl Santos, M.D.  She was placed on Coumadin for DVT prophylaxis.  It was felt that she would require inpatient rehabilitation prior to returning home due to the fact that she lives alone.  HOSPITAL COURSE:  She was admitted to inpatient rehabilitation on July 21, 1999, where she has participated in greater than three hours per day of physical and occupational therapy.  She did develop a rash which responded to hydrocortisone and was thought to be related to hospital mattress.  The patient had some urinary tract symptoms.  A UA was obtained and she was found to have an Enterococcus urinary tract infection for which she was started on Macrobid.  She progressed well with therapy to the point where she was considered a suitable candidate for discharge to home.  She was modified independent with her bathing and dressing, independent in her bed mobility and transfers, and able to ambulate 100 feet with a standard walker.  DISCHARGE MEDICATIONS: 1. Coumadin 5 mg daily for 18 days. 2. Levaquin 250 mg daily for three days. 3. Percocet one or two tablets every four to six hours as needed. 4. HCTZ 25 mg daily. 5. Potassium 30 mEq daily.  ACTIVITIES:  She is allowed weightbearing as tolerated.  WOUND CARE:  She is  instructed to keep the wound clean and dry.  DIET:  Follow a balanced diet.  DISPOSITION:  She will have home health physical and occupational therapy, as well as a nurse to draw pro times.  FOLLOW-UP:  She is instructed to follow up with Lubertha Basque. Jerl Santos, M.D., and Gordy Savers, M.D.  CONDITION ON DISCHARGE:  Stable. DD:  08/24/99 TD:  08/25/99 Job: [id number]

## 2010-11-03 NOTE — Discharge Summary (Signed)
[place]. [facility]  Patient:    ALLEYFabian, Coca                      MRN: [id number] Adm. Date:  [id number] Disc. Date: [id number] Attending:  Faith Rogue T Dictator:   Prince Rome, P.A.-C                           Discharge Summary  ADMISSION DIAGNOSIS:  End-stage degenerative joint disease, left knee.  DISCHARGE DIAGNOSIS:  End-stage degenerative joint disease, left knee.  OPERATIONS:  Left total knee replacement.  BRIEF HISTORY:  The patient is a 77 year old white female complaining of significant discomfort in her left knee.  She has been on multiple nonsteroidal  anti-inflammatory drugs, has had multiple corticosteroid injections, and is having now pain with every step and difficulty sleeping at nighttime or being comfortable. Her x-rays do reveal end-stage medial compartment DJD of the left knee.  We have discussed treatment options with the patient, those being total knee replacement.  PERTINENT LABORATORY AND X-RAY FINDINGS:  Hemoglobin on last testing on November 2, hematocrit 32.2.  Pro time was done serially.  She was on low-dose Coumadin protocol regulated by pharmacy.  COURSE IN THE HOSPITAL:  She was admitted postoperatively.  She did have an epidural spinal catheter for pain control which did work very well.  Dressings ere changed the second day postoperatively, and wounds were benign.  She was on low-dose Coumadin protocol per pharmacy.  Physical therapy was ordered for weightbearing as tolerated, gait training, and occupational therapist consult as well.  She had progressed well.  The rehabilitation case management had evaluated her, and she was accepted on the rehabilitation floor and was taken there.  CONDITION ON DISCHARGE:  Improved.  FOLLOWUP:  We will continue to follow her on the rehabilitation floor.  Staples  will be removed in two weeks postoperatively.  DISCHARGE MEDICATIONS:  She will be on low-dose  Coumadin protocol for four weeks, and appropriate pain medications were given. DD:  08/17/99 TD:  08/17/99 Job: [id number]

## 2010-11-03 NOTE — Assessment & Plan Note (Signed)
[facility]                              [facility] OFFICE NOTE   BEYONCA, WISZ                      MRN:          [id number]  DATE:04/02/2006                            DOB:          03/25/1921    The patient is an 77 year old female seen today for a wellness exam.  She  has hypertension, degenerative joint disease, chronic venous insufficiency.  She has mild hypercholesterolemia.  She has done quite well.  She has had  some vein strippings in the past.  Remote appendectomy, hysterectomy.  Also  has had some foot surgery.  She is status post left total hip replacement in  2001.   FAMILY HISTORY:  Positive for polycystic kidney disease, coronary artery  disease.   PHYSICAL EXAMINATION:  GENERAL:  Revealed a moderately overweight, healthy-  appearing white female in no acute distress.  VITAL SIGNS:  Blood pressure 1490/60.  HEENT:  Fundi, ear, nose and throat clear.  NECK:  No bruits.  CHEST:  Clear.  BREASTS:  Negative.  CARDIOVASCULAR:  Normal heart sounds no murmurs.  ABDOMEN:  Obese, soft, nontender, no organomegaly.  PELVIC:  Revealed absent uterus, no adnexal masses.  Stool heme negative.  External hemorrhoids noted.  EXTREMITIES:  Intact posterior tibial pulses, dorsalis pedis pulses were  faint.  There is no significant edema.   IMPRESSION:  Hypertension, degenerative joint disease, menopausal syndrome,  chronic venous insufficiency.   DISPOSITION:  Medical regimen unchanged.  Will reassess in six months.  Laboratory panel reviewed.            ______________________________  Gordy Savers, MD     PFK/MedQ  DD:  04/02/2006  DT:  04/03/2006  Job #:  [id number]

## 2011-02-02 ENCOUNTER — Other Ambulatory Visit: Payer: Self-pay | Admitting: Internal Medicine

## 2011-04-20 ENCOUNTER — Encounter: Payer: Medicare Other | Admitting: Internal Medicine

## 2011-04-26 ENCOUNTER — Ambulatory Visit (INDEPENDENT_AMBULATORY_CARE_PROVIDER_SITE_OTHER): Payer: Medicare Other | Admitting: Internal Medicine

## 2011-04-26 ENCOUNTER — Encounter: Payer: Self-pay | Admitting: Internal Medicine

## 2011-04-26 VITALS — BP 118/70 | HR 70 | Temp 97.6°F | Resp 16 | Ht 63.0 in | Wt 170.0 lb

## 2011-04-26 DIAGNOSIS — E785 Hyperlipidemia, unspecified: Secondary | ICD-10-CM

## 2011-04-26 DIAGNOSIS — M1990 Unspecified osteoarthritis, unspecified site: Secondary | ICD-10-CM

## 2011-04-26 DIAGNOSIS — Z136 Encounter for screening for cardiovascular disorders: Secondary | ICD-10-CM

## 2011-04-26 DIAGNOSIS — I10 Essential (primary) hypertension: Secondary | ICD-10-CM

## 2011-04-26 LAB — CBC WITH DIFFERENTIAL/PLATELET
Basophils Relative: 0.5 % (ref 0.0–3.0)
Eosinophils Relative: 5.2 % — ABNORMAL HIGH (ref 0.0–5.0)
Hemoglobin: 11.1 g/dL — ABNORMAL LOW (ref 12.0–15.0)
Lymphocytes Relative: 25.8 % (ref 12.0–46.0)
MCV: 124.2 fl — ABNORMAL HIGH (ref 78.0–100.0)
Monocytes Absolute: 0.3 10*3/uL (ref 0.1–1.0)
Neutro Abs: 4.2 10*3/uL (ref 1.4–7.7)
Neutrophils Relative %: 63.9 % (ref 43.0–77.0)
RBC: 2.68 Mil/uL — ABNORMAL LOW (ref 3.87–5.11)
WBC: 6.5 10*3/uL (ref 4.5–10.5)

## 2011-04-26 LAB — COMPREHENSIVE METABOLIC PANEL
Albumin: 4.5 g/dL (ref 3.5–5.2)
Alkaline Phosphatase: 71 U/L (ref 39–117)
BUN: 15 mg/dL (ref 6–23)
Creatinine, Ser: 1.1 mg/dL (ref 0.4–1.2)
Glucose, Bld: 98 mg/dL (ref 70–99)
Potassium: 4.5 mEq/L (ref 3.5–5.1)

## 2011-04-26 MED ORDER — HYDROCODONE-ACETAMINOPHEN 5-500 MG PO TABS: 1.0000 | ORAL_TABLET | Freq: Four times a day (QID) | ORAL | Status: DC | PRN

## 2011-04-26 MED ORDER — FELODIPINE ER 10 MG PO TB24: 10.0000 mg | ORAL_TABLET | Freq: Every day | ORAL | Status: DC

## 2011-04-26 MED ORDER — HYDROCHLOROTHIAZIDE 25 MG PO TABS: 25.0000 mg | ORAL_TABLET | Freq: Every day | ORAL | Status: DC

## 2011-04-26 NOTE — Progress Notes (Signed)
Subjective:    Patient ID: Mckenzie Jordan, female    DOB: [birth year], 77 y.o.   MRN: [id number]  HPI History of Present Illness:   77 year-old patient who is seen today for a wellness exam. She has a history of osteoarthritis and chronic low back pain. She benefited greatly by some epidurals earlier this year. She has treated hypertension and mild dyslipidemia. She denies any cardiopulmonary complaints. Her back pain is dramatically improved and she feels she does quite well at 89.   Here for Medicare AWV:   1. Risk factors based on Past M, S, F history: cardiovascular risk factors include age, hypertension, and dyslipidemia  2. Physical Activities: fairly sedentary due to age and arthritis  3. Depression/mood: no history of depression or mood disorder  4. Hearing: moderately impaired. Uses hearing aids bilaterally  5. ADL's: independent in all aspects of daily living  6. Fall Risk: moderate due to age and arthritis  7. Home Safety: no problems identifying  8. Height, weight, &visual acuity:height weight, and visual acuity, stable. Did have a recent eye exam yesterday  9. Counseling: heart healthy diet and restricted salt diet discussed and encouraged  10. Labs ordered based on risk factors: laboratory profile reviewed  11. Referral Coordination- not appropriate at this time. Follow up orthopedic as needed  12. Care Plan- laboratory panel be reviewed. Calcium and vitamin D supplementation. Encouraged  13. Cognitive Assessment- alert and oriented, with normal affect. No history of memory dysfunction. Continues to handle all executive functions without difficulty or assistance   Allergies:   1) * Flu Vaccine  2) Amoxicillin (Amoxicillin)  3) Carbamazepine (Carbamazepine)  4) Keflex (Cephalexin)   Past History:  Past Medical History:  Reviewed history from 04/15/2008 and no changes required.  Hypertension  Osteoarthritis  Chronic venous insufficiency  Hyperlipidemia  exogenous  obesity  remote history of DVT   Past Surgical History:  Appendectomy  Hysterectomy 80  Vein stripping 52  Foot surgery 89  status post left total knee replacement 2001  flexible sigmoidoscopy in 1990   Family History:  Reviewed history from 04/15/2008 and no changes required.  at age 47, coronary artery disease  mother died at age 70  3 brothers, all deceased, positive for CAD, COPD, renal failure  two sisters, both deceased renal failure, CAD, one with polycystic kidney disease   Social History:  Reviewed history from 04/15/2008 and no changes required.  Widow/Widower  Never Smoked      Review of Systems  Constitutional: Negative for fever, appetite change, fatigue and unexpected weight change.  HENT: Negative for hearing loss, ear pain, nosebleeds, congestion, sore throat, mouth sores, trouble swallowing, neck stiffness, dental problem, voice change, sinus pressure and tinnitus.   Eyes: Negative for photophobia, pain, redness and visual disturbance.  Respiratory: Negative for cough, chest tightness and shortness of breath.   Cardiovascular: Negative for chest pain, palpitations and leg swelling.  Gastrointestinal: Positive for constipation. Negative for nausea, vomiting, abdominal pain, diarrhea, blood in stool, abdominal distention and rectal pain.  Genitourinary: Negative for dysuria, urgency, frequency, hematuria, flank pain, vaginal bleeding, vaginal discharge, difficulty urinating, genital sores, vaginal pain, menstrual problem and pelvic pain.  Musculoskeletal: Negative for back pain and arthralgias.  Skin: Negative for rash.  Neurological: Negative for dizziness, syncope, speech difficulty, weakness, light-headedness, numbness and headaches.  Hematological: Negative for adenopathy. Does not bruise/bleed easily.  Psychiatric/Behavioral: Negative for suicidal ideas, behavioral problems, self-injury, dysphoric mood and agitation. The patient is not nervous/anxious.  Objective:   Physical Exam  Constitutional: She is oriented to person, place, and time. She appears well-developed and well-nourished.  HENT:  Head: Normocephalic and atraumatic.  Right Ear: External ear normal.  Left Ear: External ear normal.  Mouth/Throat: Oropharynx is clear and moist.  Eyes: Conjunctivae and EOM are normal.  Neck: Normal range of motion. Neck supple. No JVD present. No thyromegaly present.        left carotid bruit  Cardiovascular: Normal rate, regular rhythm and normal heart sounds.   No murmur heard.      Posterior tibial pulses were intact. Dorsalis pedis pulses not easily palpable.  Pulmonary/Chest: Effort normal and breath sounds normal. She has no wheezes. She has no rales.  Abdominal: Soft. Bowel sounds are normal. She exhibits no distension and no mass. There is no tenderness. There is no rebound and no guarding.  Musculoskeletal: Normal range of motion. She exhibits no edema and no tenderness.  Lymphadenopathy:    She has no cervical adenopathy.  Neurological: She is alert and oriented to person, place, and time. She has normal reflexes. No cranial nerve deficit. She exhibits normal muscle tone. Coordination normal.  Skin: Skin is warm and dry. No rash noted.  Psychiatric: She has a normal mood and affect. Her behavior is normal.          Assessment & Plan:   Preventive health examination Constipation. Issues discussed Hypertension Osteoarthritis. Clinically stable  We'll do an updated lab panel. Recheck in 6 months

## 2011-04-26 NOTE — Patient Instructions (Addendum)
Constipation in Adults Constipation is having fewer than 2 bowel movements per week. Usually, the stools are hard. As we grow older, constipation is more common. If you try to fix constipation with laxatives, the problem may get worse. This is because laxatives taken over a long period of time make the colon muscles weaker. A low-fiber diet, not taking in enough fluids, and taking some medicines may make these problems worse. MEDICATIONS THAT MAY CAUSE CONSTIPATION  Water pills (diuretics).     Calcium channel blockers (used to control blood pressure and for the heart).     Certain pain medicines (narcotics).     Anticholinergics.    Anti-inflammatory agents.     Antacids that contain aluminum.  DISEASES THAT CONTRIBUTE TO CONSTIPATION  Diabetes.     Parkinson's disease.     Dementia.    Stroke.    Depression.    Illnesses that cause problems with salt and water metabolism.  HOME CARE INSTRUCTIONS    Constipation is usually best cared for without medicines. Increasing dietary fiber and eating more fruits and vegetables is the best way to manage constipation.     Slowly increase fiber intake to 25 to 38 grams per day. Whole grains, fruits, vegetables, and legumes are good sources of fiber. A dietitian can further help you incorporate high-fiber foods into your diet.     Drink enough water and fluids to keep your urine clear or pale yellow.     A fiber supplement may be added to your diet if you cannot get enough fiber from foods.     Increasing your activities also helps improve regularity.     Suppositories, as suggested by your caregiver, will also help. If you are using antacids, such as aluminum or calcium containing products, it will be helpful to switch to products containing magnesium if your caregiver says it is okay.     If you have been given a liquid injection (enema) today, this is only a temporary measure. It should not be relied on for treatment of longstanding  (chronic) constipation.     Stronger measures, such as magnesium sulfate, should be avoided if possible. This may cause uncontrollable diarrhea. Using magnesium sulfate may not allow you time to make it to the bathroom.  SEEK IMMEDIATE MEDICAL CARE IF:    There is bright red blood in the stool.     The constipation stays for more than 4 days.     There is belly (abdominal) or rectal pain.     You do not seem to be getting better.     You have any questions or concerns.  MAKE SURE YOU:    Understand these instructions.     Will watch your condition.     Will get help right away if you are not doing well or get worse.  Document Released: 03/02/2004 Document Revised: 02/14/2011 Document Reviewed: 01/21/2008 [facility] Patient Information 2012 [place], [place].  Meds:   MiraLax  1 scoop in 8 ounces of water once or twice daily  Limit your sodium (Salt) intake  Return in 6 months for follow-up   .

## 2011-11-01 ENCOUNTER — Other Ambulatory Visit: Payer: Self-pay

## 2011-11-01 MED ORDER — HYDROCODONE-ACETAMINOPHEN 5-500 MG PO TABS: 1.0000 | ORAL_TABLET | Freq: Four times a day (QID) | ORAL | Status: DC | PRN

## 2011-11-01 NOTE — Telephone Encounter (Signed)
Called in.

## 2011-11-01 NOTE — Telephone Encounter (Signed)
Fax refill requests for vicodin [phone] Last seen 04/2011 cpx Last written 04/26/11 # [phone] [phone] Please advise

## 2011-11-01 NOTE — Telephone Encounter (Signed)
ok 

## 2012-01-07 ENCOUNTER — Encounter: Payer: Self-pay | Admitting: Internal Medicine

## 2012-01-07 ENCOUNTER — Ambulatory Visit (INDEPENDENT_AMBULATORY_CARE_PROVIDER_SITE_OTHER): Payer: Medicare Other | Admitting: Internal Medicine

## 2012-01-07 VITALS — BP 130/68 | Temp 98.0°F | Wt 150.0 lb

## 2012-01-07 DIAGNOSIS — L259 Unspecified contact dermatitis, unspecified cause: Secondary | ICD-10-CM

## 2012-01-07 DIAGNOSIS — I10 Essential (primary) hypertension: Secondary | ICD-10-CM

## 2012-01-07 DIAGNOSIS — L309 Dermatitis, unspecified: Secondary | ICD-10-CM

## 2012-01-07 MED ORDER — TRIAMCINOLONE ACETONIDE 0.1 % EX CREA: TOPICAL_CREAM | Freq: Two times a day (BID) | CUTANEOUS | Status: AC

## 2012-01-07 NOTE — Patient Instructions (Signed)
Limit your sodium (Salt) intake  Call or return to clinic prn if these symptoms worsen or fail to improve as anticipated.   

## 2012-01-07 NOTE — Progress Notes (Signed)
  Subjective:    Patient ID: Mckenzie Jordan, female    DOB: [birth year], 78 y.o.   MRN: [id number]  HPI  78 year old patient  who has a history of treated hypertension. She presents with a three-day history of a rash involving her left upper inner arm. It has responded to hydrocortisone. She was concerned about possible shingles Her blood pressure has been well-controlled on dual therapy. No other concerns or complaints   Review of Systems  Skin: Positive for rash.       Objective:   Physical Exam  Constitutional: She is oriented to person, place, and time. She appears well-developed and well-nourished.  HENT:  Head: Normocephalic.  Right Ear: External ear normal.  Left Ear: External ear normal.  Mouth/Throat: Oropharynx is clear and moist.  Eyes: Conjunctivae and EOM are normal. Pupils are equal, round, and reactive to light.  Neck: Normal range of motion. Neck supple. No thyromegaly present.  Cardiovascular: Normal rate, regular rhythm, normal heart sounds and intact distal pulses.   Pulmonary/Chest: Effort normal and breath sounds normal.  Abdominal: Soft. Bowel sounds are normal. She exhibits no mass. There is no tenderness.  Musculoskeletal: Normal range of motion.  Lymphadenopathy:    She has no cervical adenopathy.  Neurological: She is alert and oriented to person, place, and time.  Skin: Skin is warm and dry. Rash noted.       Erythematous somewhat papular rash involving her left upper inner arm. No vesicles noted  Psychiatric: She has a normal mood and affect. Her behavior is normal.          Assessment & Plan:   Nonspecific dermatitis. Possible shingles but doubtful. Rash is quite limited and not very uncomfortable. Will treat with triamcinolone and observe. Doubt any benefit with antiretrovirals even if clearly shingles

## 2012-08-11 ENCOUNTER — Other Ambulatory Visit: Payer: Self-pay | Admitting: Internal Medicine

## 2012-11-17 ENCOUNTER — Encounter: Payer: Self-pay | Admitting: Family Medicine

## 2012-11-17 ENCOUNTER — Ambulatory Visit (INDEPENDENT_AMBULATORY_CARE_PROVIDER_SITE_OTHER): Payer: Medicare Other | Admitting: Family Medicine

## 2012-11-17 VITALS — BP 160/78 | Temp 98.3°F | Wt 156.0 lb

## 2012-11-17 DIAGNOSIS — H9190 Unspecified hearing loss, unspecified ear: Secondary | ICD-10-CM

## 2012-11-17 DIAGNOSIS — M791 Myalgia, unspecified site: Secondary | ICD-10-CM

## 2012-11-17 DIAGNOSIS — H9193 Unspecified hearing loss, bilateral: Secondary | ICD-10-CM

## 2012-11-17 DIAGNOSIS — IMO0001 Reserved for inherently not codable concepts without codable children: Secondary | ICD-10-CM

## 2012-11-17 NOTE — Progress Notes (Signed)
Chief Complaint  Patient presents with  . left ear clogged    HPI:  Acute visit for L ear hearing aide not working: -uses hearing aides - but feels like they only work for the first part of the day and then don't work as well -thinks maybe has wax in ears and wanted to check this -denies: pain in ears, vertigo, fevers, HA -person whom services her hearing aide is in nursing home  Pain L knee: -ant superior to knee -reports has hx of OA and knee replacement and chronic issues with pain -intermittent pain -denies: weakness, falls, numbness, bowel or bladder issues, precipitating event   ROS: See pertinent positives and negatives per HPI.  Past Medical History  Diagnosis Date  . HYPERLIPIDEMIA 02/13/2007  . HYPERTENSION 02/13/2007  . OSTEOARTHRITIS 02/13/2007  . DVT (deep venous thrombosis)     remote hx of  . Obesity   . Chronic venous insufficiency     Family History  Problem Relation Age of Onset  . Heart disease Father   . Heart disease Sister   . COPD Brother   . Heart disease Brother   . Kidney disease Brother   . Heart disease Sister   . Kidney disease Sister   . COPD Brother   . Heart disease Brother   . Kidney disease Brother   . COPD Brother   . Heart disease Brother   . Kidney disease Brother     History   Social History  . Marital Status: Widowed    Spouse Name: N/A    Number of Children: N/A  . Years of Education: N/A   Social History Main Topics  . Smoking status: Never Smoker   . Smokeless tobacco: Never Used  . Alcohol Use: No  . Drug Use: No  . Sexually Active: None   Other Topics Concern  . None   Social History Narrative  . None    Current outpatient prescriptions:felodipine (PLENDIL) 10 MG 24 hr tablet, TAKE 1 TABLET BY MOUTH ONCE DAILY, Disp: 90 tablet, Rfl: 1;  hydrochlorothiazide (HYDRODIURIL) 25 MG tablet, TAKE 1 TABLET BY MOUTH ONCE DAILY, Disp: 90 tablet, Rfl: 1;  HYDROcodone-acetaminophen (VICODIN) 5-500 MG per tablet, Take  1 tablet by mouth every 6 (six) hours as needed., Disp: 60 tablet, Rfl: 3 triamcinolone cream (KENALOG) 0.1 %, Apply topically 2 (two) times daily., Disp: 30 g, Rfl: 0  EXAM:  Filed Vitals:   11/17/12 1546  BP: 160/78  Temp: 98.3 F (36.8 C)    Body mass index is 27.64 kg/(m^2).  GENERAL: vitals reviewed and listed above, alert, oriented, appears well hydrated and in no acute distress  HEENT: atraumatic, conjunttiva clear, no obvious abnormalities on inspection of external nose and ears  NECK: no obvious masses on inspection  MS: moves all extremities without noticeable abnormality -inspection of L knee and leg reveals no effusion, swelling or erythema -some mild quadricep muscle TTP -normal ROM knee and hip and normal muscle strength in LE bilat, normal gait  PSYCH: pleasant and cooperative, no obvious depression or anxiety  ASSESSMENT AND PLAN:  Discussed the following assessment and plan:  Hearing loss, bilateral -L ear looks normal on exam with no cerumen impaction and I suspect she may have an issue with her hearing aide, advised to see audiology - since her audiologist is no longer available provided her with audiology info  Muscle soreness -some mild TTP in quadricept muscle on L - norma gait and strength with no known trauma  to this area -hx knee replacement -advised conservative measures with heat, tylenol and follow up with PCP in 4 weeks if persists  -Patient advised to return or notify a doctor immediately if symptoms worsen or persist or new concerns arise.  Patient Instructions  -see the audiologist about your hearing aid  -for leg pain: -heat 15 minutes 2x daily -leg exercises once daily -tylenol 500mg  up to 3 times daily -follow up with your doctor in 1 month     Estell Puccini, [facility].

## 2012-11-17 NOTE — Patient Instructions (Signed)
-  see the audiologist about your hearing aid  -for leg pain: -heat 15 minutes 2x daily -leg exercises once daily -tylenol 500mg  up to 3 times daily -follow up with your doctor in 1 month

## 2012-12-18 ENCOUNTER — Ambulatory Visit (INDEPENDENT_AMBULATORY_CARE_PROVIDER_SITE_OTHER): Payer: Medicare Other | Admitting: Family Medicine

## 2012-12-18 ENCOUNTER — Encounter: Payer: Self-pay | Admitting: Family Medicine

## 2012-12-18 VITALS — BP 162/70 | Temp 97.7°F | Wt 157.0 lb

## 2012-12-18 DIAGNOSIS — G8929 Other chronic pain: Secondary | ICD-10-CM

## 2012-12-18 DIAGNOSIS — M25569 Pain in unspecified knee: Secondary | ICD-10-CM

## 2012-12-18 NOTE — Patient Instructions (Signed)
-  continue tylenol 500-1000mg  up to 2-3 times per day as needed for pain  -also can try over the counter menthol or capsicin topical creams  -follow up with Dr. Amador Cunas in 1-2 months

## 2012-12-18 NOTE — Progress Notes (Signed)
Chief Complaint  Patient presents with  . sore leg and knees    HPI:  Follow up L knee pain: -s/p knee replacement in this knee -reports has chronic issues with this knee, per PCP notes took some vicodin for this in the past -she had some quadriceps muscle soreness last visit and we advised conservative care and follow up with PCP -she was confuse why was being seen by me today -denies: falls, weakness, giving away, clicking or popping, malaise weight loss, radiation of pain -tylenol helps the pain, activity makes it worse ROS: See pertinent positives and negatives per HPI.  Past Medical History  Diagnosis Date  . HYPERLIPIDEMIA 02/13/2007  . HYPERTENSION 02/13/2007  . OSTEOARTHRITIS 02/13/2007  . DVT (deep venous thrombosis)     remote hx of  . Obesity   . Chronic venous insufficiency     Family History  Problem Relation Age of Onset  . Heart disease Father   . Heart disease Sister   . COPD Brother   . Heart disease Brother   . Kidney disease Brother   . Heart disease Sister   . Kidney disease Sister   . COPD Brother   . Heart disease Brother   . Kidney disease Brother   . COPD Brother   . Heart disease Brother   . Kidney disease Brother     History   Social History  . Marital Status: Widowed    Spouse Name: N/A    Number of Children: N/A  . Years of Education: N/A   Social History Main Topics  . Smoking status: Never Smoker   . Smokeless tobacco: Never Used  . Alcohol Use: No  . Drug Use: No  . Sexually Active: None   Other Topics Concern  . None   Social History Narrative  . None    Current outpatient prescriptions:felodipine (PLENDIL) 10 MG 24 hr tablet, TAKE 1 TABLET BY MOUTH ONCE DAILY, Disp: 90 tablet, Rfl: 1;  hydrochlorothiazide (HYDRODIURIL) 25 MG tablet, TAKE 1 TABLET BY MOUTH ONCE DAILY, Disp: 90 tablet, Rfl: 1;  HYDROcodone-acetaminophen (VICODIN) 5-500 MG per tablet, Take 1 tablet by mouth every 6 (six) hours as needed., Disp: 60 tablet,  Rfl: 3 triamcinolone cream (KENALOG) 0.1 %, Apply topically 2 (two) times daily., Disp: 30 g, Rfl: 0  EXAM:  Filed Vitals:   12/18/12 1322  BP: 162/70  Temp: 97.7 F (36.5 C)    Body mass index is 27.82 kg/(m^2).  GENERAL: vitals reviewed and listed above, alert, oriented, appears well hydrated and in no acute distress  HEENT: atraumatic, conjunttiva clear, no obvious abnormalities on inspection of external nose and ears  NECK: no obvious masses on inspection  MS: moves all extremities without noticeable abnormality walks with cain, inspection of knee reveals surgical scar - no erythema or warmth, TTP medial and lateral joint line, no longer has TTP in quadriceps muscle, neg valgus and varus stress, neg lachman  PSYCH: pleasant and cooperative, no obvious depression or anxiety  ASSESSMENT AND PLAN:  Discussed the following assessment and plan:  Chronic knee pain, left  -discussed options including imaging, referral she requested versus continuing tylenol which is working for her pain or other options for pain -for now she decided will continue tylenol and will follow up with PCP for routine follow up and follow up HTN -Patient advised to return or notify a doctor immediately if symptoms worsen or persist or new concerns arise.  Patient Instructions  -continue tylenol 500-1000mg  up to  2-3 times per day as needed for pain  -also can try over the counter menthol or capsicin topical creams  -follow up with Dr. Amador Cunas in 1-2 months     Mckenzie Jordan.

## 2013-02-05 ENCOUNTER — Other Ambulatory Visit: Payer: Self-pay | Admitting: Internal Medicine

## 2013-07-31 ENCOUNTER — Other Ambulatory Visit: Payer: Self-pay | Admitting: Internal Medicine

## 2014-02-16 ENCOUNTER — Other Ambulatory Visit: Payer: Self-pay | Admitting: Internal Medicine

## 2014-02-19 ENCOUNTER — Encounter: Payer: Self-pay | Admitting: Internal Medicine

## 2014-02-19 ENCOUNTER — Ambulatory Visit (INDEPENDENT_AMBULATORY_CARE_PROVIDER_SITE_OTHER): Payer: Medicare Other | Admitting: Internal Medicine

## 2014-02-19 VITALS — BP 140/60 | HR 50 | Temp 97.7°F | Resp 20 | Ht 63.0 in | Wt 170.0 lb

## 2014-02-19 DIAGNOSIS — I10 Essential (primary) hypertension: Secondary | ICD-10-CM

## 2014-02-19 DIAGNOSIS — E785 Hyperlipidemia, unspecified: Secondary | ICD-10-CM

## 2014-02-19 DIAGNOSIS — M1990 Unspecified osteoarthritis, unspecified site: Secondary | ICD-10-CM

## 2014-02-19 LAB — CBC WITH DIFFERENTIAL/PLATELET
BASOS ABS: 0 10*3/uL (ref 0.0–0.1)
BASOS PCT: 0.5 % (ref 0.0–3.0)
EOS ABS: 0.3 10*3/uL (ref 0.0–0.7)
Eosinophils Relative: 4.1 % (ref 0.0–5.0)
HEMATOCRIT: 34 % — AB (ref 36.0–46.0)
HEMOGLOBIN: 11.4 g/dL — AB (ref 12.0–15.0)
LYMPHS PCT: 19.5 % (ref 12.0–46.0)
Lymphs Abs: 1.6 10*3/uL (ref 0.7–4.0)
MCHC: 33.7 g/dL (ref 30.0–36.0)
MCV: 103.5 fl — ABNORMAL HIGH (ref 78.0–100.0)
Monocytes Absolute: 0.5 10*3/uL (ref 0.1–1.0)
Monocytes Relative: 5.9 % (ref 3.0–12.0)
Neutro Abs: 5.7 10*3/uL (ref 1.4–7.7)
Neutrophils Relative %: 70 % (ref 43.0–77.0)
Platelets: 247 10*3/uL (ref 150.0–400.0)
RBC: 3.28 Mil/uL — ABNORMAL LOW (ref 3.87–5.11)
RDW: 17.5 % — ABNORMAL HIGH (ref 11.5–15.5)
WBC: 8.1 10*3/uL (ref 4.0–10.5)

## 2014-02-19 LAB — COMPREHENSIVE METABOLIC PANEL
ALT: 12 U/L (ref 0–35)
AST: 20 U/L (ref 0–37)
Albumin: 4 g/dL (ref 3.5–5.2)
Alkaline Phosphatase: 95 U/L (ref 39–117)
BUN: 20 mg/dL (ref 6–23)
CALCIUM: 9.3 mg/dL (ref 8.4–10.5)
CHLORIDE: 104 meq/L (ref 96–112)
CO2: 29 mEq/L (ref 19–32)
CREATININE: 1.5 mg/dL — AB (ref 0.4–1.2)
GFR: 35.69 mL/min — ABNORMAL LOW (ref >60.00)
Glucose, Bld: 106 mg/dL — ABNORMAL HIGH (ref 70–99)
Potassium: 4.5 mEq/L (ref 3.5–5.1)
Sodium: 140 mEq/L (ref 135–145)
Total Bilirubin: 0.9 mg/dL (ref 0.2–1.2)
Total Protein: 7 g/dL (ref 6.0–8.3)

## 2014-02-19 LAB — TSH: TSH: 0.62 u[IU]/mL (ref 0.35–4.50)

## 2014-02-19 MED ORDER — HYDROCHLOROTHIAZIDE 25 MG PO TABS: ORAL_TABLET | ORAL | Status: DC

## 2014-02-19 MED ORDER — FELODIPINE ER 10 MG PO TB24: ORAL_TABLET | ORAL | Status: DC

## 2014-02-19 NOTE — Patient Instructions (Addendum)
Limit your sodium (Salt) intake  Please check your blood pressure on a regular basis.  If it is consistently greater than 150/90, please make an office appointment.    Take Aleve 200 mg twice daily for pain or swelling  Take 500-650 mg of Tylenol every 6 hours as needed for pain relief or fever.  Avoid taking more than 3000 mg in a 24-hour period ( This may cause liver damage).

## 2014-02-19 NOTE — Progress Notes (Signed)
Subjective:    Patient ID: Mckenzie Jordan, female    DOB: 16-Oct-1920, 80 y.o.   MRN: [id number]  HPI  69 -year-old patient who is seen today in followup.  She has not been seen in over one year.  She does remarkably well with a history of hypertension and osteoarthritis.  She has bilateral knee pain.  She has fallen once over the past year.  She does have a cane, walker, and wheelchair available.  She lives alone Does have a cell phone Takes rare Tylenol and Aleve, but not on a regular basis No recent labs  Past Medical History  Diagnosis Date  . HYPERLIPIDEMIA 02/13/2007  . HYPERTENSION 02/13/2007  . OSTEOARTHRITIS 02/13/2007  . DVT (deep venous thrombosis)     remote hx of  . Obesity   . Chronic venous insufficiency     History   Social History  . Marital Status: Widowed    Spouse Name: N/A    Number of Children: N/A  . Years of Education: N/A   Occupational History  . Not on file.   Social History Main Topics  . Smoking status: Never Smoker   . Smokeless tobacco: Never Used  . Alcohol Use: No  . Drug Use: No  . Sexual Activity: Not on file   Other Topics Concern  . Not on file   Social History Narrative  . No narrative on file    Past Surgical History  Procedure Laterality Date  . Appendectomy    . Abdominal hysterectomy    . Varicose vein surgery    . Foot surgery    . Joint replacement      knee , left    Family History  Problem Relation Age of Onset  . Heart disease Father   . Heart disease Sister   . COPD Brother   . Heart disease Brother   . Kidney disease Brother   . Heart disease Sister   . Kidney disease Sister   . COPD Brother   . Heart disease Brother   . Kidney disease Brother   . COPD Brother   . Heart disease Brother   . Kidney disease Brother     Allergies  Allergen Reactions  . Amoxicillin     REACTION: unspecified  . Carbamazepine     REACTION: unspecified  . Cephalexin     REACTION: unspecified    Current  Outpatient Prescriptions on File Prior to Visit  Medication Sig Dispense Refill  . felodipine (PLENDIL) 10 MG 24 hr tablet TAKE 1 TABLET BY MOUTH EVERY DAY  90 tablet  0  . hydrochlorothiazide (HYDRODIURIL) 25 MG tablet TAKE 1 TABLET BY MOUTH EVERY DAY  90 tablet  1   No current facility-administered medications on file prior to visit.    BP 140/60  Pulse 50  Temp(Src) 97.7 F (36.5 C) (Oral)  Resp 20  Ht  (1.6 m)  Wt 170 lb (77.111 kg)  BMI 30.12 kg/m2  SpO2 95%     Review of Systems  Constitutional: Negative.   HENT: Negative for congestion, dental problem, hearing loss, rhinorrhea, sinus pressure, sore throat and tinnitus.   Eyes: Negative for pain, discharge and visual disturbance.  Respiratory: Negative for cough and shortness of breath.   Cardiovascular: Negative for chest pain, palpitations and leg swelling.  Gastrointestinal: Negative for nausea, vomiting, abdominal pain, diarrhea, constipation, blood in stool and abdominal distention.  Genitourinary: Negative for dysuria, urgency, frequency, hematuria, flank pain, vaginal bleeding,  vaginal discharge, difficulty urinating, vaginal pain and pelvic pain.  Musculoskeletal: Positive for arthralgias and gait problem. Negative for joint swelling.  Skin: Negative for rash.  Neurological: Negative for dizziness, syncope, speech difficulty, weakness, numbness and headaches.  Hematological: Negative for adenopathy.  Psychiatric/Behavioral: Negative for behavioral problems, dysphoric mood and agitation. The patient is not nervous/anxious.        Objective:   Physical Exam  Constitutional: She is oriented to person, place, and time. She appears well-developed and well-nourished.  Blood pressure 140/70 Somewhat frail, but able to stand from a sitting position, and transferred to the examining table without assistance  HENT:  Head: Normocephalic.  Right Ear: External ear normal.  Left Ear: External ear normal.    Mouth/Throat: Oropharynx is clear and moist.  Eyes: Conjunctivae and EOM are normal. Pupils are equal, round, and reactive to light.  Neck: Normal range of motion. Neck supple. No thyromegaly present.  Cardiovascular: Normal rate, regular rhythm, normal heart sounds and intact distal pulses.   Pulmonary/Chest: Effort normal and breath sounds normal.  Abdominal: Soft. Bowel sounds are normal. She exhibits no mass. There is no tenderness.  Musculoskeletal: Normal range of motion.  Lymphadenopathy:    She has no cervical adenopathy.  Neurological: She is alert and oriented to person, place, and time.  Skin: Skin is warm and dry. No rash noted.  Psychiatric: She has a normal mood and affect. Her behavior is normal.          Assessment & Plan:   Hypertension.  Well controlled.  Medical regimen unchanged Osteoarthritis.  Continue Aleve when necessary and add the Tylenol.  Patient will consider a 3 times a day regimen History dyslipidemia  Laboratory at the reviewed Recheck one year

## 2014-02-19 NOTE — Progress Notes (Signed)
Pre visit review using our clinic review tool, if applicable. No additional management support is needed unless otherwise documented below in the visit note. 

## 2014-03-03 ENCOUNTER — Telehealth: Payer: Self-pay | Admitting: Internal Medicine

## 2014-03-03 NOTE — Telephone Encounter (Signed)
Please call ronnie pt son with blood work results. Please call on cell

## 2014-03-04 NOTE — Telephone Encounter (Signed)
Please call/notify patient that lab/test/procedure is normal 

## 2014-03-04 NOTE — Telephone Encounter (Signed)
Son calling for lab results from 9/4. Please advise

## 2014-03-04 NOTE — Telephone Encounter (Signed)
Left message on voicemail to call office.  

## 2014-03-05 NOTE — Telephone Encounter (Signed)
Spoke to pt's son Christen Bame, told him lab work was normal per Dr. Debara Pickett asked if I would mail copy. Told him will put in the mail will go out on Monday. Ronnie verbalized understanding.Copy of labs mailed.

## 2014-04-13 ENCOUNTER — Ambulatory Visit (INDEPENDENT_AMBULATORY_CARE_PROVIDER_SITE_OTHER): Payer: Medicare Other | Admitting: *Deleted

## 2014-04-13 DIAGNOSIS — Z111 Encounter for screening for respiratory tuberculosis: Secondary | ICD-10-CM

## 2014-04-15 LAB — TB SKIN TEST
Induration: 0 mm
TB Skin Test: NEGATIVE

## 2014-09-08 ENCOUNTER — Telehealth: Payer: Self-pay | Admitting: Internal Medicine

## 2014-09-08 NOTE — Telephone Encounter (Signed)
Patient daughter in law states that they received a [facility] stating that Felodipine 10mg  ER is not the drug list and they will no longer pay for this medication. I have made a copy of the letter and will forward this to you. Patient daughter would like to know what are the option for this. Patient daughter states that she would like for her mom to stay on this medication if possible. Please give the patient daughter in law or son call to discuss this [phone] Junious DresserConnie or Glade LloydRonnie Poe and states that it is okay to leave a message on phone.

## 2014-09-08 NOTE — Telephone Encounter (Signed)
Spoke to pt's daughter Mckenzie DresserConnie told her need to contact insurance and find out what medication is on her formulary to replace Felodipine and then let me know and we can change medication. Mckenzie DresserConnie verbalized understanding.

## 2014-09-08 NOTE — Telephone Encounter (Signed)
Called and spoke to Sunrise Manoronnie.  She is aware I submitted a PA was submitted.  Tobias AlexanderDonna, Erica will give you the [facility] [employer] preferred formulary print out to review.

## 2014-09-08 NOTE — Telephone Encounter (Signed)
PA has been Approved for 1 year. Junious DresserConnie is aware.

## 2014-10-27 ENCOUNTER — Other Ambulatory Visit: Payer: Self-pay | Admitting: *Deleted

## 2014-10-27 MED ORDER — FELODIPINE ER 10 MG PO TB24: ORAL_TABLET | ORAL | Status: DC

## 2015-01-15 ENCOUNTER — Observation Stay (HOSPITAL_COMMUNITY)
Admission: EM | Admit: 2015-01-15 | Discharge: 2015-01-18 | Disposition: A | Discharge status: Home / Self Care | Payer: Medicare Other | Attending: Internal Medicine | Admitting: Internal Medicine

## 2015-01-15 ENCOUNTER — Emergency Department (HOSPITAL_COMMUNITY): Payer: Medicare Other

## 2015-01-15 ENCOUNTER — Encounter (HOSPITAL_COMMUNITY): Payer: Self-pay | Admitting: Emergency Medicine

## 2015-01-15 DIAGNOSIS — N179 Acute kidney failure, unspecified: Secondary | ICD-10-CM | POA: Diagnosis not present

## 2015-01-15 DIAGNOSIS — Z880 Allergy status to penicillin: Secondary | ICD-10-CM | POA: Diagnosis not present

## 2015-01-15 DIAGNOSIS — I872 Venous insufficiency (chronic) (peripheral): Secondary | ICD-10-CM | POA: Diagnosis not present

## 2015-01-15 DIAGNOSIS — Z79899 Other long term (current) drug therapy: Secondary | ICD-10-CM | POA: Diagnosis not present

## 2015-01-15 DIAGNOSIS — E785 Hyperlipidemia, unspecified: Secondary | ICD-10-CM | POA: Diagnosis not present

## 2015-01-15 DIAGNOSIS — E669 Obesity, unspecified: Secondary | ICD-10-CM | POA: Diagnosis not present

## 2015-01-15 DIAGNOSIS — I10 Essential (primary) hypertension: Secondary | ICD-10-CM | POA: Diagnosis not present

## 2015-01-15 DIAGNOSIS — Z86718 Personal history of other venous thrombosis and embolism: Secondary | ICD-10-CM | POA: Insufficient documentation

## 2015-01-15 DIAGNOSIS — R55 Syncope and collapse: Secondary | ICD-10-CM | POA: Diagnosis not present

## 2015-01-15 DIAGNOSIS — Z23 Encounter for immunization: Secondary | ICD-10-CM | POA: Diagnosis not present

## 2015-01-15 DIAGNOSIS — M1990 Unspecified osteoarthritis, unspecified site: Secondary | ICD-10-CM | POA: Insufficient documentation

## 2015-01-15 DIAGNOSIS — D72829 Elevated white blood cell count, unspecified: Secondary | ICD-10-CM

## 2015-01-15 DIAGNOSIS — L8990 Pressure ulcer of unspecified site, unspecified stage: Secondary | ICD-10-CM | POA: Insufficient documentation

## 2015-01-15 DIAGNOSIS — I499 Cardiac arrhythmia, unspecified: Secondary | ICD-10-CM | POA: Insufficient documentation

## 2015-01-15 DIAGNOSIS — R4182 Altered mental status, unspecified: Secondary | ICD-10-CM | POA: Diagnosis present

## 2015-01-15 DIAGNOSIS — N200 Calculus of kidney: Secondary | ICD-10-CM | POA: Insufficient documentation

## 2015-01-15 HISTORY — DX: Calculus of kidney: N20.0

## 2015-01-15 LAB — COMPREHENSIVE METABOLIC PANEL
ALBUMIN: 3.5 g/dL (ref 3.5–5.0)
ALT: 24 U/L (ref 14–54)
AST: 72 U/L — AB (ref 15–41)
Alkaline Phosphatase: 92 U/L (ref 38–126)
Anion gap: 8 (ref 5–15)
BUN: 26 mg/dL — ABNORMAL HIGH (ref 6–20)
CALCIUM: 9.3 mg/dL (ref 8.9–10.3)
CO2: 27 mmol/L (ref 22–32)
CREATININE: 1.87 mg/dL — AB (ref 0.44–1.00)
Chloride: 103 mmol/L (ref 101–111)
GFR calc Af Amer: 26 mL/min — ABNORMAL LOW (ref >60)
GFR calc non Af Amer: 22 mL/min — ABNORMAL LOW (ref >60)
Glucose, Bld: 129 mg/dL — ABNORMAL HIGH (ref 65–99)
Potassium: 4 mmol/L (ref 3.5–5.1)
SODIUM: 138 mmol/L (ref 135–145)
Total Bilirubin: 1.3 mg/dL — ABNORMAL HIGH (ref 0.3–1.2)
Total Protein: 6.2 g/dL — ABNORMAL LOW (ref 6.5–8.1)

## 2015-01-15 LAB — URINALYSIS, ROUTINE W REFLEX MICROSCOPIC
GLUCOSE, UA: NEGATIVE mg/dL
Glucose, UA: NEGATIVE mg/dL
KETONES UR: 15 mg/dL — AB
Ketones, ur: 15 mg/dL — AB
LEUKOCYTES UA: NEGATIVE
Nitrite: NEGATIVE
Nitrite: NEGATIVE
PROTEIN: 30 mg/dL — AB
Protein, ur: 100 mg/dL — AB
SPECIFIC GRAVITY, URINE: 1.024 (ref 1.005–1.030)
Specific Gravity, Urine: 1.022 (ref 1.005–1.030)
UROBILINOGEN UA: 1 mg/dL (ref 0.0–1.0)
Urobilinogen, UA: 1 mg/dL (ref 0.0–1.0)
pH: 5.5 (ref 5.0–8.0)
pH: 6 (ref 5.0–8.0)

## 2015-01-15 LAB — CBC WITH DIFFERENTIAL/PLATELET
Basophils Absolute: 0 10*3/uL (ref 0.0–0.1)
Basophils Relative: 0 % (ref 0–1)
EOS ABS: 0 10*3/uL (ref 0.0–0.7)
Eosinophils Relative: 0 % (ref 0–5)
HCT: 35.8 % — ABNORMAL LOW (ref 36.0–46.0)
HEMOGLOBIN: 12 g/dL (ref 12.0–15.0)
Lymphocytes Relative: 8 % — ABNORMAL LOW (ref 12–46)
Lymphs Abs: 1.2 10*3/uL (ref 0.7–4.0)
MCH: 32.3 pg (ref 26.0–34.0)
MCHC: 33.5 g/dL (ref 30.0–36.0)
MCV: 96.5 fL (ref 78.0–100.0)
MONO ABS: 0.6 10*3/uL (ref 0.1–1.0)
MONOS PCT: 4 % (ref 3–12)
NEUTROS PCT: 88 % — AB (ref 43–77)
Neutro Abs: 12.6 10*3/uL — ABNORMAL HIGH (ref 1.7–7.7)
Platelets: 260 10*3/uL (ref 150–400)
RBC: 3.71 MIL/uL — ABNORMAL LOW (ref 3.87–5.11)
RDW: 17.2 % — ABNORMAL HIGH (ref 11.5–15.5)
WBC: 14.4 10*3/uL — ABNORMAL HIGH (ref 4.0–10.5)

## 2015-01-15 LAB — URINE MICROSCOPIC-ADD ON

## 2015-01-15 LAB — TROPONIN I: TROPONIN I: 0.06 ng/mL — AB (ref <0.031)

## 2015-01-15 MED ORDER — SODIUM CHLORIDE 0.9 % IV BOLUS (SEPSIS)
500.0000 mL | Freq: Once | INTRAVENOUS | Status: AC
  Administered 2015-01-15: 500 mL via INTRAVENOUS

## 2015-01-15 NOTE — ED Provider Notes (Signed)
CSN: [id number]     Arrival date & time 01/15/15  1918 History   First MD Initiated Contact with Patient 01/15/15 1921     Chief Complaint  Patient presents with  . Altered Mental Status     (Consider location/radiation/quality/duration/timing/severity/associated sxs/prior Treatment) Patient is a 81 y.o. female presenting with syncope.  Loss of Consciousness Episode history:  Multiple Most recent episode:  Today Duration: felt to be short in duration.   Timing:  Intermittent Chronicity:  Recurrent (had similar episode a few weeks ago) Context comment:  First episode today while walking to the bathroom.  second episode while getting dressed.   Witnessed: no   Relieved by:  Nothing Associated symptoms: no chest pain, no difficulty breathing, no dizziness, no fever, no nausea, no palpitations, no shortness of breath and no vomiting     Past Medical History  Diagnosis Date  . HYPERLIPIDEMIA 02/13/2007  . HYPERTENSION 02/13/2007  . OSTEOARTHRITIS 02/13/2007  . DVT (deep venous thrombosis)     remote hx of  . Obesity   . Chronic venous insufficiency    Past Surgical History  Procedure Laterality Date  . Appendectomy    . Abdominal hysterectomy    . Varicose vein surgery    . Foot surgery    . Joint replacement      knee , left   Family History  Problem Relation Age of Onset  . Heart disease Father   . Heart disease Sister   . COPD Brother   . Heart disease Brother   . Kidney disease Brother   . Heart disease Sister   . Kidney disease Sister   . COPD Brother   . Heart disease Brother   . Kidney disease Brother   . COPD Brother   . Heart disease Brother   . Kidney disease Brother    History  Substance Use Topics  . Smoking status: Never Smoker   . Smokeless tobacco: Never Used  . Alcohol Use: No   OB History    No data available     Review of Systems  Constitutional: Negative for fever.  Respiratory: Negative for shortness of breath.   Cardiovascular:  Positive for syncope. Negative for chest pain and palpitations.  Gastrointestinal: Negative for nausea and vomiting.  Neurological: Negative for dizziness.  All other systems reviewed and are negative.     Allergies  Amoxicillin; Carbamazepine; and Cephalexin  Home Medications   Prior to Admission medications   Medication Sig Start Date End Date Taking? Authorizing Provider  felodipine (PLENDIL) 10 MG 24 hr tablet TAKE 1 TABLET BY MOUTH EVERY DAY 10/27/14   Gordy Savers, MD  hydrochlorothiazide (HYDRODIURIL) 25 MG tablet TAKE 1 TABLET BY MOUTH EVERY DAY 02/19/14   Gordy Savers, MD  naproxen sodium (ALEVE) 220 MG tablet Take 220 mg by mouth as needed.    Historical Provider, MD   BP 125/50 mmHg  Pulse 100  Temp(Src) 97.9 F (36.6 C) (Oral)  SpO2 96% Physical Exam  Constitutional: She is oriented to person, place, and time. She appears well-developed and well-nourished. No distress.  HENT:  Head: Normocephalic and atraumatic.    Mouth/Throat: Oropharynx is clear and moist.  Eyes: Conjunctivae are normal. Pupils are equal, round, and reactive to light. No scleral icterus.  Neck: Neck supple.  Cardiovascular: Normal rate, normal heart sounds and intact distal pulses.  An irregularly irregular rhythm present.  No murmur heard. Pulmonary/Chest: Effort normal and breath sounds normal. No stridor. No respiratory  distress. She has no rales.  Abdominal: Soft. Bowel sounds are normal. She exhibits no distension. There is no tenderness.  Musculoskeletal: Normal range of motion.  Neurological: She is alert and oriented to person, place, and time.  Skin: Skin is warm and dry. No rash noted.  Psychiatric: She has a normal mood and affect. Her behavior is normal.  Nursing note and vitals reviewed.   ED Course  Procedures (including critical care time) Labs Review Labs Reviewed  CBC WITH DIFFERENTIAL/PLATELET - Abnormal; Notable for the following:    WBC 14.4 (*)    RBC  3.71 (*)    HCT 35.8 (*)    RDW 17.2 (*)    Neutrophils Relative % 88 (*)    Neutro Abs 12.6 (*)    Lymphocytes Relative 8 (*)    All other components within normal limits  COMPREHENSIVE METABOLIC PANEL - Abnormal; Notable for the following:    Glucose, Bld 129 (*)    BUN 26 (*)    Creatinine, Ser 1.87 (*)    Total Protein 6.2 (*)    AST 72 (*)    Total Bilirubin 1.3 (*)    GFR calc non Af Amer 22 (*)    GFR calc Af Amer 26 (*)    All other components within normal limits  TROPONIN I - Abnormal; Notable for the following:    Troponin I 0.06 (*)    All other components within normal limits  URINALYSIS, ROUTINE W REFLEX MICROSCOPIC (NOT AT [facility]) - Abnormal; Notable for the following:    Color, Urine AMBER (*)    APPearance CLOUDY (*)    Hgb urine dipstick LARGE (*)    Bilirubin Urine SMALL (*)    Ketones, ur 15 (*)    Protein, ur 100 (*)    Leukocytes, UA MODERATE (*)    All other components within normal limits  URINE MICROSCOPIC-ADD ON - Abnormal; Notable for the following:    Squamous Epithelial / LPF FEW (*)    Bacteria, UA FEW (*)    All other components within normal limits  URINE CULTURE  URINALYSIS, ROUTINE W REFLEX MICROSCOPIC (NOT AT [facility])    Imaging Review Ct Head Wo Contrast  01/15/2015   CLINICAL DATA:  Syncope and collapse. Found on the floor of this afternoon.  EXAM: CT HEAD WITHOUT CONTRAST  CT CERVICAL SPINE WITHOUT CONTRAST  TECHNIQUE: Multidetector CT imaging of the head and cervical spine was performed following the standard protocol without intravenous contrast. Multiplanar CT image reconstructions of the cervical spine were also generated.  COMPARISON:  None.  FINDINGS: CT HEAD FINDINGS  Age-related atrophy. Mild chronic small vessel ischemic change.No intracranial hemorrhage, mass effect, or midline shift. No hydrocephalus. The basilar cisterns are patent. No evidence of territorial infarct. No intracranial fluid collection. Calvarium is intact. Included  paranasal sinuses and mastoid air cells are well aerated.  CT CERVICAL SPINE FINDINGS  Minimal anterolisthesis of C4 on C5 is degenerative, alignment is otherwise maintained. There is no fracture. Vertebral body heights are preserved. The dens is intact. There are no jumped or perched facets. Disc space narrowing at C4-C5 and C5-C6. Multilevel endplate spurs. There is multilevel facet arthropathy. No prevertebral soft tissue edema.  IMPRESSION: 1. No acute intracranial abnormality. Age related atrophy and mild chronic small vessel ischemia. 2. Multilevel degenerative change throughout cervical spine without acute fracture or subluxation.   Electronically Signed   By: Rubye Oaks M.D.   On: 01/15/2015 21:13   Ct Cervical  Spine Wo Contrast  01/15/2015   CLINICAL DATA:  Syncope and collapse. Found on the floor of this afternoon.  EXAM: CT HEAD WITHOUT CONTRAST  CT CERVICAL SPINE WITHOUT CONTRAST  TECHNIQUE: Multidetector CT imaging of the head and cervical spine was performed following the standard protocol without intravenous contrast. Multiplanar CT image reconstructions of the cervical spine were also generated.  COMPARISON:  None.  FINDINGS: CT HEAD FINDINGS  Age-related atrophy. Mild chronic small vessel ischemic change.No intracranial hemorrhage, mass effect, or midline shift. No hydrocephalus. The basilar cisterns are patent. No evidence of territorial infarct. No intracranial fluid collection. Calvarium is intact. Included paranasal sinuses and mastoid air cells are well aerated.  CT CERVICAL SPINE FINDINGS  Minimal anterolisthesis of C4 on C5 is degenerative, alignment is otherwise maintained. There is no fracture. Vertebral body heights are preserved. The dens is intact. There are no jumped or perched facets. Disc space narrowing at C4-C5 and C5-C6. Multilevel endplate spurs. There is multilevel facet arthropathy. No prevertebral soft tissue edema.  IMPRESSION: 1. No acute intracranial abnormality.  Age related atrophy and mild chronic small vessel ischemia. 2. Multilevel degenerative change throughout cervical spine without acute fracture or subluxation.   Electronically Signed   By: Rubye Oaks M.D.   On: 01/15/2015 21:13   Dg Chest Portable 1 View  01/15/2015   CLINICAL DATA:  Unwitnessed fall.  EXAM: PORTABLE CHEST - 1 VIEW  COMPARISON:  None.  FINDINGS: A single AP portable view of the chest demonstrates no focal airspace consolidation or alveolar edema. The lungs are grossly clear. There is no large effusion or pneumothorax. Cardiac and mediastinal contours appear unremarkable.  IMPRESSION: No active disease.   Electronically Signed   By: Ellery Plunk M.D.   On: 01/15/2015 22:47     EKG Interpretation   Date/Time:  Saturday January 15 2015 19:37:22 EDT Ventricular Rate:  84 PR Interval:  206 QRS Duration: 90 QT Interval:  353 QTC Calculation: 417 R Axis:   39 Text Interpretation:  Sinus tachycardia Paired ventricular premature  complexes No old tracing to compare Confirmed by [facility]  MD, TREY ([id number])  on 01/15/2015 11:22:34 PM      MDM   Final diagnoses:  Syncope and collapse  AKI (acute kidney injury)  Leukocytosis    81 yo female with two episodes of syncope today.  Also with generalized weakness. Labs notable for questionable UTI (will get cath urine), acute kidney injury, leukocytosis, and minimally elevated troponin. She will be admitted for further workup.    Blake Divine, MD 01/15/15 [phone]

## 2015-01-15 NOTE — ED Notes (Signed)
Per EMS, family found pt on floor in living room this afternoon. Pt not acting like herself according to family. Family stated to EMS that she was having difficulty answering questions and performing normal tasks. Pt CAO upon arrival to the ER and answering questions appropriately.

## 2015-01-15 NOTE — H&P (Addendum)
[facility] Admission History and Physical       Mckenzie Jordan:[id number] DOB: 1921-03-23 DOA: 01/15/2015  Referring physician: EDP PCP: Rogelia Boga, MD  Specialists:   Chief Complaint: Passed Out  HPI: Mckenzie Jordan is a 81 y.o. female with a history of HTN, and Hyperlipidemia who presents tot he ED with 2 episodes of Syncope today.   She reports that the first episode occurred when she got up in the AM to go to the Bathroom.    The second episode occurred  After she was up walking and she was found by her family on the floor and was too weak to get up.  She was confused afterward.  She denies any chest pain or headache or SOB, and also denies having any nausea vomiting or diarrhea.  A Ct Scan of the Head was performed and was negative for acute findings.   She was referred for medical admission.     Review of Systems:  Constitutional: No Weight Loss, No Weight Gain, Night Sweats, Fevers, Chills, Dizziness, Light Headedness, Fatigue, +Generalized Weakness HEENT: No Headaches, Difficulty Swallowing,Tooth/Dental Problems,Sore Throat,  No Sneezing, Rhinitis, Ear Ache, Nasal Congestion, or Post Nasal Drip,  Cardio-vascular:  No Chest pain, Orthopnea, PND, Edema in Lower Extremities, Anasarca, Dizziness, Palpitations  Resp: No Dyspnea, No DOE, No Productive Cough, No Non-Productive Cough, No Hemoptysis, No Wheezing.    GI: No Heartburn, Indigestion, Abdominal Pain, Nausea, Vomiting, Diarrhea, Constipation, Hematemesis, Hematochezia, Melena, Change in Bowel Habits,  Loss of Appetite  GU: No Dysuria, No Change in Color of Urine, No Urgency or Urinary Frequency, No Flank pain.  Musculoskeletal: No Joint Pain or Swelling, No Decreased Range of Motion, No Back Pain.  Neurologic:  +Syncope, No Seizures, Muscle Weakness, Paresthesia, Vision Disturbance or Loss, No Diplopia, No Vertigo, No Difficulty Walking,  Skin: No Rash or Lesions. Psych: No Change in Mood or Affect, No  Depression or Anxiety, No Memory loss, No Confusion, or Hallucinations   Past Medical History  Diagnosis Date  . HYPERLIPIDEMIA 02/13/2007  . HYPERTENSION 02/13/2007  . OSTEOARTHRITIS 02/13/2007  . DVT (deep venous thrombosis)     remote hx of  . Obesity   . Chronic venous insufficiency      Past Surgical History  Procedure Laterality Date  . Appendectomy    . Abdominal hysterectomy    . Varicose vein surgery    . Foot surgery    . Joint replacement      knee , left      Prior to Admission medications   Medication Sig Start Date End Date Taking? Authorizing Provider  felodipine (PLENDIL) 10 MG 24 hr tablet TAKE 1 TABLET BY MOUTH EVERY DAY Patient taking differently: Take 10 mg by mouth daily.  10/27/14  Yes Gordy Savers, MD  hydrochlorothiazide (HYDRODIURIL) 25 MG tablet TAKE 1 TABLET BY MOUTH EVERY DAY Patient taking differently: Take 25 mg by mouth daily.  02/19/14  Yes Gordy Savers, MD  naproxen sodium (ALEVE) 220 MG tablet Take 220 mg by mouth daily.    Yes Historical Provider, MD     Allergies  Allergen Reactions  . Carbamazepine Other (See Comments)    Family was not aware of this reaction  . Amoxicillin Rash  . Cephalexin Rash    Social History:  Lives Alone, Able to perform all of her ADLs  reports that she has never smoked. She has never used smokeless tobacco. She reports that she does not drink alcohol or  use illicit drugs.    Family History  Problem Relation Age of Onset  . Heart disease Father   . Heart disease Sister   . COPD Brother   . Heart disease Brother   . Kidney disease Brother   . Heart disease Sister   . Kidney disease Sister   . COPD Brother   . Heart disease Brother   . Kidney disease Brother   . COPD Brother   . Heart disease Brother   . Kidney disease Brother        Physical Exam:  GEN:  Pleasant Elderly Well Nourished and Well Developed  81 y.o. Caucasian female examined and in no acute distress; cooperative with  exam Filed Vitals:   01/15/15 1940 01/15/15 2030 01/15/15 2145 01/15/15 2200  BP: 125/50 106/69 146/71 145/43  Pulse: 100 47 51 96  Temp: 97.9 F (36.6 C)     TempSrc: Oral     Resp:  SpO2: 96% 97% 98% 98%   Blood pressure 145/43, pulse 96, temperature 97.9 F (36.6 C), temperature source Oral, resp. rate 17, SpO2 98 %. PSYCH: She is alert and oriented x4; does not appear anxious does not appear depressed; affect is normal HEENT: Normocephalic and Atraumatic, Mucous membranes pink; PERRLA; EOM intact; Fundi:  Benign;  No scleral icterus, Nares: Patent, Oropharynx: Clear,Fair Dentition,    Neck:  FROM, No Cervical Lymphadenopathy nor Thyromegaly or Carotid Bruit; No JVD; Breasts:: Not examined CHEST WALL: No tenderness CHEST: Normal respiration, clear to auscultation bilaterally HEART: Irregular Irregular rate and rhythm; no murmurs rubs or gallops BACK: No kyphosis or scoliosis; No CVA tenderness ABDOMEN: Positive Bowel Sounds, Soft Non-Tender, No Rebound or Guarding; No Masses, No Organomegaly Rectal Exam: Not done EXTREMITIES: No Cyanosis, Clubbing, or Edema; No Ulcerations. Genitalia: not examined PULSES: 2+ and symmetric SKIN: Normal hydration no rash or ulceration CNS:  Alert and Oriented x 4, No Focal Deficits Vascular: pulses palpable throughout    Labs on Admission:  Basic Metabolic Panel:  Recent Labs Lab 01/15/15 2029  NA 138  K 4.0  CL 103  CO2 27  GLUCOSE 129*  BUN 26*  CREATININE 1.87*  CALCIUM 9.3   Liver Function Tests:  Recent Labs Lab 01/15/15 2029  AST 72*  ALT 24  ALKPHOS 92  BILITOT 1.3*  PROT 6.2*  ALBUMIN 3.5   No results for input(s): LIPASE, AMYLASE in the last 168 hours. No results for input(s): AMMONIA in the last 168 hours. CBC:  Recent Labs Lab 01/15/15 2029  WBC 14.4*  NEUTROABS 12.6*  HGB 12.0  HCT 35.8*  MCV 96.5  PLT 260   Cardiac Enzymes:  Recent Labs Lab 01/15/15 2029  TROPONINI 0.06*    BNP  (last 3 results) No results for input(s): BNP in the last 8760 hours.  ProBNP (last 3 results) No results for input(s): PROBNP in the last 8760 hours.  CBG: No results for input(s): GLUCAP in the last 168 hours.  Radiological Exams on Admission: Ct Head Wo Contrast  01/15/2015   CLINICAL DATA:  Syncope and collapse. Found on the floor of this afternoon.  EXAM: CT HEAD WITHOUT CONTRAST  CT CERVICAL SPINE WITHOUT CONTRAST  TECHNIQUE: Multidetector CT imaging of the head and cervical spine was performed following the standard protocol without intravenous contrast. Multiplanar CT image reconstructions of the cervical spine were also generated.  COMPARISON:  None.  FINDINGS: CT HEAD FINDINGS  Age-related atrophy. Mild chronic small vessel ischemic change.No intracranial  hemorrhage, mass effect, or midline shift. No hydrocephalus. The basilar cisterns are patent. No evidence of territorial infarct. No intracranial fluid collection. Calvarium is intact. Included paranasal sinuses and mastoid air cells are well aerated.  CT CERVICAL SPINE FINDINGS  Minimal anterolisthesis of C4 on C5 is degenerative, alignment is otherwise maintained. There is no fracture. Vertebral body heights are preserved. The dens is intact. There are no jumped or perched facets. Disc space narrowing at C4-C5 and C5-C6. Multilevel endplate spurs. There is multilevel facet arthropathy. No prevertebral soft tissue edema.  IMPRESSION: 1. No acute intracranial abnormality. Age related atrophy and mild chronic small vessel ischemia. 2. Multilevel degenerative change throughout cervical spine without acute fracture or subluxation.   Electronically Signed   By: Rubye Oaks M.D.   On: 01/15/2015 21:13   Ct Cervical Spine Wo Contrast  01/15/2015   CLINICAL DATA:  Syncope and collapse. Found on the floor of this afternoon.  EXAM: CT HEAD WITHOUT CONTRAST  CT CERVICAL SPINE WITHOUT CONTRAST  TECHNIQUE: Multidetector CT imaging of the head and  cervical spine was performed following the standard protocol without intravenous contrast. Multiplanar CT image reconstructions of the cervical spine were also generated.  COMPARISON:  None.  FINDINGS: CT HEAD FINDINGS  Age-related atrophy. Mild chronic small vessel ischemic change.No intracranial hemorrhage, mass effect, or midline shift. No hydrocephalus. The basilar cisterns are patent. No evidence of territorial infarct. No intracranial fluid collection. Calvarium is intact. Included paranasal sinuses and mastoid air cells are well aerated.  CT CERVICAL SPINE FINDINGS  Minimal anterolisthesis of C4 on C5 is degenerative, alignment is otherwise maintained. There is no fracture. Vertebral body heights are preserved. The dens is intact. There are no jumped or perched facets. Disc space narrowing at C4-C5 and C5-C6. Multilevel endplate spurs. There is multilevel facet arthropathy. No prevertebral soft tissue edema.  IMPRESSION: 1. No acute intracranial abnormality. Age related atrophy and mild chronic small vessel ischemia. 2. Multilevel degenerative change throughout cervical spine without acute fracture or subluxation.   Electronically Signed   By: Rubye Oaks M.D.   On: 01/15/2015 21:13   Dg Chest Portable 1 View  01/15/2015   CLINICAL DATA:  Unwitnessed fall.  EXAM: PORTABLE CHEST - 1 VIEW  COMPARISON:  None.  FINDINGS: A single AP portable view of the chest demonstrates no focal airspace consolidation or alveolar edema. The lungs are grossly clear. There is no large effusion or pneumothorax. Cardiac and mediastinal contours appear unremarkable.  IMPRESSION: No active disease.   Electronically Signed   By: Ellery Plunk M.D.   On: 01/15/2015 22:47     EKG: Independently reviewed. Sinus Arrhythmia, with Paired PVCs rate = 84   Assessment/Plan:   81 y.o. female with  Principal Problem:   1.   Syncope   Cardiac Monitoring   Cycle Troponins   Check Orthostatic Vitals   2D ECHO in AM   Cards  Consult   PT evaluation due to Weakness      Active Problems:   2.   AKI (acute kidney injury)- due to HCTZ Rx   IVFs   Hold HCTZ   Monitor BUN/Cr     3.   Leukocytosis- Re-check UA, send Urine C+S if +and start Empiric Rx   Monitor Trend     4.   Hyperlipidemia   Check Fasting Lipids in AM     5.   Essential hypertension   On HCTZ, on Hold due to AKI   Monitor BPs  PRN IV Hydralazine     6.   DVT Prophylaxis   Lovenox          Code Status:     FULL CODE       Family Communication:   Family at Bedside      Disposition Plan:    Observation Status        Time spent:  49 Minutes      Ron Parker [facility] Pager [phone]   If 7AM -7PM Please Contact the Day Rounding Team MD for [facility]  If 7PM-7AM, Please Contact Night-Floor Coverage  [email or web address] Password [email or web address] 01/15/2015, 11:07 PM     ADDENDUM:   Patient was seen and examined on 01/15/2015

## 2015-01-16 ENCOUNTER — Encounter (HOSPITAL_COMMUNITY): Payer: Self-pay | Admitting: *Deleted

## 2015-01-16 DIAGNOSIS — I499 Cardiac arrhythmia, unspecified: Secondary | ICD-10-CM | POA: Diagnosis not present

## 2015-01-16 DIAGNOSIS — R55 Syncope and collapse: Secondary | ICD-10-CM

## 2015-01-16 DIAGNOSIS — I10 Essential (primary) hypertension: Secondary | ICD-10-CM | POA: Diagnosis not present

## 2015-01-16 DIAGNOSIS — N179 Acute kidney failure, unspecified: Secondary | ICD-10-CM

## 2015-01-16 DIAGNOSIS — D72829 Elevated white blood cell count, unspecified: Secondary | ICD-10-CM | POA: Diagnosis not present

## 2015-01-16 DIAGNOSIS — N3000 Acute cystitis without hematuria: Secondary | ICD-10-CM | POA: Diagnosis not present

## 2015-01-16 DIAGNOSIS — L8990 Pressure ulcer of unspecified site, unspecified stage: Secondary | ICD-10-CM | POA: Insufficient documentation

## 2015-01-16 LAB — CBC
HEMATOCRIT: 31.6 % — AB (ref 36.0–46.0)
Hemoglobin: 10.5 g/dL — ABNORMAL LOW (ref 12.0–15.0)
MCH: 32.2 pg (ref 26.0–34.0)
MCHC: 33.2 g/dL (ref 30.0–36.0)
MCV: 96.9 fL (ref 78.0–100.0)
Platelets: 223 10*3/uL (ref 150–400)
RBC: 3.26 MIL/uL — ABNORMAL LOW (ref 3.87–5.11)
RDW: 17.4 % — AB (ref 11.5–15.5)
WBC: 11 10*3/uL — ABNORMAL HIGH (ref 4.0–10.5)

## 2015-01-16 LAB — BASIC METABOLIC PANEL
Anion gap: 10 (ref 5–15)
BUN: 26 mg/dL — AB (ref 6–20)
CO2: 26 mmol/L (ref 22–32)
Calcium: 8.6 mg/dL — ABNORMAL LOW (ref 8.9–10.3)
Chloride: 104 mmol/L (ref 101–111)
Creatinine, Ser: 1.69 mg/dL — ABNORMAL HIGH (ref 0.44–1.00)
GFR calc Af Amer: 29 mL/min — ABNORMAL LOW (ref >60)
GFR, EST NON AFRICAN AMERICAN: 25 mL/min — AB (ref >60)
Glucose, Bld: 103 mg/dL — ABNORMAL HIGH (ref 65–99)
POTASSIUM: 3.7 mmol/L (ref 3.5–5.1)
Sodium: 140 mmol/L (ref 135–145)

## 2015-01-16 LAB — TROPONIN I
TROPONIN I: 0.06 ng/mL — AB (ref <0.031)
Troponin I: 0.05 ng/mL — ABNORMAL HIGH (ref <0.031)
Troponin I: 0.05 ng/mL — ABNORMAL HIGH (ref <0.031)

## 2015-01-16 LAB — MAGNESIUM: Magnesium: 1.8 mg/dL (ref 1.7–2.4)

## 2015-01-16 MED ORDER — FELODIPINE ER 10 MG PO TB24
10.0000 mg | ORAL_TABLET | Freq: Every day | ORAL | Status: DC
  Administered 2015-01-16 – 2015-01-18 (×3): 10 mg via ORAL
  Filled 2015-01-16 (×3): qty 1

## 2015-01-16 MED ORDER — ACETAMINOPHEN 325 MG PO TABS: 650.0000 mg | ORAL_TABLET | Freq: Four times a day (QID) | ORAL | Status: DC | PRN

## 2015-01-16 MED ORDER — SODIUM CHLORIDE 0.9 % IJ SOLN
3.0000 mL | Freq: Two times a day (BID) | INTRAMUSCULAR | Status: DC
  Administered 2015-01-17: 3 mL via INTRAVENOUS

## 2015-01-16 MED ORDER — ONDANSETRON HCL 4 MG PO TABS: 4.0000 mg | ORAL_TABLET | Freq: Four times a day (QID) | ORAL | Status: DC | PRN

## 2015-01-16 MED ORDER — HYDROMORPHONE HCL 1 MG/ML IJ SOLN: 0.5000 mg | INTRAMUSCULAR | Status: DC | PRN

## 2015-01-16 MED ORDER — ONDANSETRON HCL 4 MG/2ML IJ SOLN: 4.0000 mg | Freq: Four times a day (QID) | INTRAMUSCULAR | Status: DC | PRN

## 2015-01-16 MED ORDER — ENOXAPARIN SODIUM 30 MG/0.3ML ~~LOC~~ SOLN
30.0000 mg | SUBCUTANEOUS | Status: DC
  Administered 2015-01-16 – 2015-01-17 (×2): 30 mg via SUBCUTANEOUS
  Filled 2015-01-16 (×3): qty 0.3

## 2015-01-16 MED ORDER — ALUM & MAG HYDROXIDE-SIMETH 200-200-20 MG/5ML PO SUSP: 30.0000 mL | Freq: Four times a day (QID) | ORAL | Status: DC | PRN

## 2015-01-16 MED ORDER — OXYCODONE HCL 5 MG PO TABS
5.0000 mg | ORAL_TABLET | ORAL | Status: DC | PRN
  Administered 2015-01-17 – 2015-01-18 (×4): 5 mg via ORAL
  Filled 2015-01-16 (×4): qty 1

## 2015-01-16 MED ORDER — SODIUM CHLORIDE 0.9 % IV SOLN
INTRAVENOUS | Status: DC
  Administered 2015-01-16 – 2015-01-17 (×3): via INTRAVENOUS

## 2015-01-16 MED ORDER — SODIUM CHLORIDE 0.9 % IV SOLN
INTRAVENOUS | Status: DC
  Administered 2015-01-16: 01:00:00 via INTRAVENOUS

## 2015-01-16 MED ORDER — ACETAMINOPHEN 650 MG RE SUPP
650.0000 mg | Freq: Four times a day (QID) | RECTAL | Status: DC | PRN
Start: 2015-01-16 — End: 2015-01-18

## 2015-01-16 MED ORDER — CIPROFLOXACIN HCL 500 MG PO TABS
500.0000 mg | ORAL_TABLET | Freq: Every day | ORAL | Status: DC
  Administered 2015-01-16 – 2015-01-18 (×3): 500 mg via ORAL
  Filled 2015-01-16 (×3): qty 1

## 2015-01-16 MED ORDER — PNEUMOCOCCAL VAC POLYVALENT 25 MCG/0.5ML IJ INJ
0.5000 mL | INJECTION | INTRAMUSCULAR | Status: AC
  Administered 2015-01-17: 0.5 mL via INTRAMUSCULAR
  Filled 2015-01-16: qty 0.5

## 2015-01-16 MED ORDER — POTASSIUM CHLORIDE CRYS ER 20 MEQ PO TBCR
40.0000 meq | EXTENDED_RELEASE_TABLET | Freq: Once | ORAL | Status: AC
  Administered 2015-01-16: 40 meq via ORAL
  Filled 2015-01-16: qty 2

## 2015-01-16 NOTE — Consult Note (Signed)
Consulting cardiologist: Dr Dina Rich MD  Clinical Summary Mckenzie Jordan is a 80 y.o.female history of HTN, HL admitted with syncope.   Reports 2 episodes. 1st episode occurred after waking up and going to the bathroom. Second episode occurred shortly after standing while walking. She reports normal oral intake. Drinks 2 bottles of water daily, along with tea and Dr Reino Kent. Denies any chest pain. Notes occasional palpitations.   Cr 1.87 (baseline 1.1-1.5), BUN 26, WBC 14.4, Plt 260, Hgb 12, Mg 1.8, K 3.7 Trop 0.05 and flat CXR no acute process CT head no acute process EKG SR, PACs and PVCs Echo pending Orthostatics incomplete, patient unable to stand. 10 beat increase in HR with sitting.    Allergies  Allergen Reactions  . Carbamazepine Other (See Comments)    Family was not aware of this reaction  . Amoxicillin Rash  . Cephalexin Rash    Medications Scheduled Medications: . ciprofloxacin  500 mg Oral Q breakfast  . enoxaparin (LOVENOX) injection  30 mg Subcutaneous Q24H  . felodipine  10 mg Oral Daily  . [START ON 01/17/2015] pneumococcal 23 valent vaccine  0.5 mL Intramuscular Tomorrow-1000  . sodium chloride  3 mL Intravenous Q12H     Infusions: . sodium chloride 75 mL/hr at 01/16/15 0109     PRN Medications:  acetaminophen **OR** acetaminophen, alum & mag hydroxide-simeth, ondansetron **OR** ondansetron (ZOFRAN) IV, oxyCODONE   Past Medical History  Diagnosis Date  . HYPERLIPIDEMIA 02/13/2007  . HYPERTENSION 02/13/2007  . OSTEOARTHRITIS 02/13/2007  . DVT (deep venous thrombosis)     remote hx of  . Obesity   . Chronic venous insufficiency   . Kidney stone     Past Surgical History  Procedure Laterality Date  . Appendectomy    . Abdominal hysterectomy    . Varicose vein surgery    . Foot surgery    . Joint replacement      knee , left    Family History  Problem Relation Age of Onset  . Heart disease Father   . Heart disease Sister   . COPD  Brother   . Heart disease Brother   . Kidney disease Brother   . Heart disease Sister   . Kidney disease Sister   . COPD Brother   . Heart disease Brother   . Kidney disease Brother   . COPD Brother   . Heart disease Brother   . Kidney disease Brother     Social History [name] reports that she has never smoked. She has never used smokeless tobacco. [name] reports that she does not drink alcohol.  Review of Systems CONSTITUTIONAL: No weight loss, fever, chills, weakness or fatigue.  HEENT: Eyes: No visual loss, blurred vision, double vision or yellow sclerae. No hearing loss, sneezing, congestion, runny nose or sore throat.  SKIN: No rash or itching.  CARDIOVASCULAR: per HPI  RESPIRATORY: No shortness of breath, cough or sputum.  GASTROINTESTINAL: No anorexia, nausea, vomiting or diarrhea. No abdominal pain or blood.  GENITOURINARY: no polyuria, no dysuria NEUROLOGICAL: syncope  MUSCULOSKELETAL: No muscle, back pain, joint pain or stiffness.  HEMATOLOGIC: No anemia, bleeding or bruising.  LYMPHATICS: No enlarged nodes. No history of splenectomy.  PSYCHIATRIC: No history of depression or anxiety.      Physical Examination Blood pressure 125/42, pulse 62, temperature 97.5 F (36.4 C), temperature source Oral, resp. rate 16, height  (1.676 m), weight 172 lb (78.019 kg), SpO2 96 %.  Intake/Output Summary (  Last 24 hours) at 01/16/15 1233 Last data filed at 01/16/15 0810  Gross per 24 hour  Intake 476.25 ml  Output    225 ml  Net 251.25 ml    HEENT: sclera clear  Cardiovascular: RRR, 2/6 systolic murmur RUSB, nO JVD  Respiratory: CTAB  GI: abdomen soft, NT, ND  MSK: no LE edema  Neuro: no focal deficits  Psych: appropirate affect   Lab Results  Basic Metabolic Panel:  Recent Labs Lab 01/15/15 2029 01/16/15 0508 01/16/15 1025  NA 138 140  --   K 4.0 3.7  --   CL 103 104  --   CO2 27 26  --   GLUCOSE 129* 103*  --   BUN 26* 26*  --     CREATININE 1.87* 1.69*  --   CALCIUM 9.3 8.6*  --   MG  --   --  1.8    Liver Function Tests:  Recent Labs Lab 01/15/15 2029  AST 72*  ALT 24  ALKPHOS 92  BILITOT 1.3*  PROT 6.2*  ALBUMIN 3.5    CBC:  Recent Labs Lab 01/15/15 2029 01/16/15 0508  WBC 14.4* 11.0*  NEUTROABS 12.6*  --   HGB 12.0 10.5*  HCT 35.8* 31.6*  MCV 96.5 96.9  PLT 260 223    Cardiac Enzymes:  Recent Labs Lab 01/15/15 2029 01/15/15 2342 01/16/15 0508 01/16/15 1120  TROPONINI 0.06* 0.05* 0.06* 0.05*    BNP: Invalid input(s): POCBNP     Impression/Recommendations 1. Syncope - most likely secondary to hypovolemia. Cr and BUN suggests she is dry, Cr trended down with 500mL NS bolus yesterday. Cannot complete orthostatics because she cannot stand however 10 beat increase i<MEASUREMENKoreaTElpidiEncompass Health D6395MarylandMaWest Palm BeKoreaaChildrens Hospital Of New Harrah's EnteRedge Paris [facility] - South [phone] 346Fleet ConKentuckytrasry Larsenwarkal Incg, and this was after her <MEASUREMEAmada [facility] [phone] AnAssociated Surgical Center Of Dear[facility] LLC2<MEASUREMENTAmada Stone [phone] Suite OfRidgewood Surgery And EndosColonie Physicians Day Surgery Ctr2<MEASUREMENTAmada [facility] 62385-826<MEASUREMEElpidiWest(442) 76MarylandMaBridgeviKorealDaybHarrah's EnteRedge Nix [facility] Dilley Te1914587-971Fleet ConKentuckytrasry LarsenkaneCenternter: Lambert Rhonda W2<MEASUREMENTAmada [facility] [phone] Health [facility]2<MEASUREMENWashington Hospital2<MEASUREMENT KoreaadElpidiClarinda R681-38MarylandMaSaint MaKorearCascadeHarrah's EnteRedge Central Washington Hospi1914513-6Fleet ConKentuc tKoElpidiSiskin Hospital For Ph570 50MarylandMaAKorearUpstate Orthopedics Ambulatory SurHarrah's EnteRedge Assension [facility] On [facility]ald Co1914720-616Fleet ConKentuckytrasry Larsen [phone] Psychiatric Center2<MEASUREMENTAmada [place] [phone] JorMason RiHillsboro Area Hospital KoreaME(ElpidiMemorial Med([phone])11MarylandMaMaynKoreaaValley Medical PlazaHarrah's EnteRedge [facility] Medical Cen1914743-915Fleet ConKentuckytrasry Larsen Ascshland Orthopedic And Spine Hospital [phone] GPromedica [facility]2<MEASUREMENTAmada [facility] ([phone] Idaho Advanced Care [facility]2<MEASUREMENTAmada [facility]2<MEASUREMENTAmada Lakeland RSpecial KoreElpidiBhatti 919-29MarylandMaCaKoreanSaint Lukes SurgicenHarrah's EnteRedge [facility] Asc 1914773-684Fleet ConKentuckytrasry Larsenmmiter LLCenter<MEASUREMENElpidiKaiser Permanent662-04MarylandMaAthKoreaeAbrazo ScHarrah's EnteRedge The [facility] 1914571-314Fleet ConKentuc KoElpidiMu9525MarylandMaHattieviKorealHackettstown RegionalHarrah's EnteRedge Revision Advanced Surgery Center 1914703-(579Fleet ConKentuckytrasry LarsennterSystemnsteCentral Louisiana State Hospita2<MEASUREMENTAmada Prisma [facility] Surgery CeBeaumont Hospital Wayne2<MEClarion Psychiatric C tKoreae(HidElpidiRiv443-46MarylandMaPaulKoreaiAurora Vista DHarrah's EnteRedge Horizon Specialty Hospital Of Hender1914330-5Fleet ConKentuckytrasry LarsenitalCenteravita Med([phone] [facility]2<MEASUREMENTAmada [place] Memoria425 875 9St [facility]2<MEASUREMENTAmada [facility] [place] Endosc([phone]2<MEASUREMENTAmada [facility] [phone] Dig KoreatiLake Elpid212-07MarylandMaSeneca GardKoreaeTaylor Station SurgHarrah's EnteRedge West [facility] Center For Surger1914631-458Fleet ConKentuc KorElpidiMorristown-Hamb615-87MarylandMaRock HoKoreNorthern California Advanced SuHarrah's EnteRedge Kurt G Vernon [phone] ConKentuc tKoElpidi(508) 17MarylandMaVega AKorealAtlanta Va HealthHarrah's EnteRedge [facility] 1914704-6Fleet ConKentuckytrasry Larsennterspital2<MEASUREMENTAmada71517-741Dini-[facility] Northern Neva Korea E269 88MarylandMaSperryviKorealMusc Health LancasterHarrah's EnteRedge Arrow[facility]al Hea1914([phone] ConKentuc Koreat(ElpidiSan Antonio Gastroenterology E351-48MarylandMaDuPKoreaoParview InvernessHarrah's EnteRedge [facility] [phone] ConKentuc tKorear(MElpidiElk910-67MarylandMaMonticeKorealAmbulatory Surgery Center OHarrah's EnteRedge Peace[facility] Medical Cen1914778-301Fleet ConKentuc KoreatrWhiElpidiMemorial Hermann Sur480-78MarylandMaHigh BriKoreadBaylor Institute For RehabilitHarrah's EnteRedge [facility] 1914715-351Fleet ConKentuckytrasry L[phone]<MEASUREMENTAmada St Thomas Medical roKoreElpidiComplex Care 7015MarylandMaPulpotio BarKoreaeArbouHarrah's EnteRedge North Hills Surgicare1914818-(432Fleet ConKentuckytrasry Larsen Thegelakerthwest Florida Surgery Center2<MEASUREMENTAmada Garfield [phone] EnBaptist Medical RoHighland Ridge Hospital2<MEASUREMENTAmada Grace HospitaJoliet Surgery Center Limited Partnership2<MEASUREMENTAmada Sharp [phone] Korea OSouthElpidiUrology Associates 330 76MarylandMaAmerican FKoreaoMercy St. FHarrah's EnteRedge Mineral Area Regional Medical [phone])551Fleet ConKentuckytrasry LarsenitalforniaSUREMENTAmada Childrens Hsptl OfUniversity Of Md Sho2<MEASUREMENTAmada [facility]70(740[facility]2<MEASUREMENTAmada Cl[facility] Va [phone] BehavioraPremier Specialty Hospital [facility]2<MEASUREMENTAmada Guthrie CoThe Mackool Eye Institute LLC2<MEASUREMENTAmada East [facility] Regional Medi40814-526-89495Kaiser Fnd RVa Maine Healthcare System Togus2<MEASUREMENTAmada [facility] OrtWest Feliciana Parish [facility]2.952Darien Ramus [phone] HospiFitzNiobrara Valley [phone] have contributed. - mild heart murmur, f/u echo - tele reviewed, frequent PACs mainly. Occasional PVCs, wandering atrial pacemaker, wenchebach. Isolated 1.6 second sinus pause. Overall not significant brady or tachycarrhythmia to explain episode.   2. Troponin elevation - mild nonspecific flat troponin elevation, no specific ischemic changes on EKG. F/u echo.   Vashti Bolanos, M.D.

## 2015-01-16 NOTE — Progress Notes (Signed)
ANTIBIOTIC CONSULT NOTE - INITIAL  Pharmacy Consult for ciprofloxacin  Indication: UTI  Allergies  Allergen Reactions  . Carbamazepine Other (See Comments)    Family was not aware of this reaction  . Amoxicillin Rash  . Cephalexin Rash    Patient Measurements: Height:  (167.6 cm) Weight: 172 lb (78.019 kg) IBW/kg (Calculated) : 59.3 Adjusted Body Weight:   Vital Signs: Temp: 99 F (37.2 C) (07/31 0815) Temp Source: Oral (07/31 0815) BP: 125/47 mmHg (07/31 1049) Pulse Rate: 63 (07/31 1049) Intake/Output from previous day: 07/30 0701 - 07/31 0700 In: -  Out: 200 [Urine:200] Intake/Output from this shift: Total I/O In: 476.3 [I.V.:476.3] Out: -   Labs:  Recent Labs  01/15/15 2029 01/16/15 0508  WBC 14.4* 11.0*  HGB 12.0 10.5*  PLT 260 223  CREATININE 1.87* 1.69*   Estimated Creatinine Clearance: 22.9 mL/min (by C-G formula based on Cr of 1.69). No results for input(s): VANCOTROUGH, VANCOPEAK, VANCORANDOM, GENTTROUGH, GENTPEAK, GENTRANDOM, TOBRATROUGH, TOBRAPEAK, TOBRARND, AMIKACINPEAK, AMIKACINTROU, AMIKACIN in the last 72 hours.   Microbiology: No results found for this or any previous visit (from the past 720 hour(s)).  Medical History: Past Medical History  Diagnosis Date  . HYPERLIPIDEMIA 02/13/2007  . HYPERTENSION 02/13/2007  . OSTEOARTHRITIS 02/13/2007  . DVT (deep venous thrombosis)     remote hx of  . Obesity   . Chronic venous insufficiency   . Kidney stone     Medications:  Prescriptions prior to admission  Medication Sig Dispense Refill Last Dose  . felodipine (PLENDIL) 10 MG 24 hr tablet TAKE 1 TABLET BY MOUTH EVERY DAY (Patient taking differently: Take 10 mg by mouth daily. ) 90 tablet 1 01/15/2015 at Unknown time  . hydrochlorothiazide (HYDRODIURIL) 25 MG tablet TAKE 1 TABLET BY MOUTH EVERY DAY (Patient taking differently: Take 25 mg by mouth daily. ) 90 tablet 4 01/15/2015 at Unknown time  . naproxen sodium (ALEVE) 220 MG tablet Take  220 mg by mouth daily.    01/15/2015 at Unknown time   Assessment: 81 yo female admitted with syncope, CT head negative. Initiating abx for UTI. WBC 14.4 > 11, afeb, UA does not appear infectious.  Goal of Therapy:  Resolution of infection  Plan:  -Ciprofloxacin 500 mg po daily  -F/u urine cx  Agapito Games, PharmD, BCPS Clinical Pharmacist Pager: [phone] 01/16/2015 10:57 AM

## 2015-01-16 NOTE — Evaluation (Signed)
Physical Therapy Evaluation Patient Details Name: Mckenzie Jordan MRN: [id number] DOB: Oct 21, 1920 Today's Date: 01/16/2015   History of Present Illness  HPI: Mckenzie Jordan is a 81 y.o. female with a history of HTN, and Hyperlipidemia who presents tot he ED with 2 episodes of Syncope today. She reports that the first episode occurred when she got up in the AM to go to the Bathroom. The second episode occurred After she was up walking and she was found by her family on the floor and was too weak to get up. Syncope workup in progress  Clinical Impression   Pt admitted with above diagnosis. Pt currently with functional limitations due to the deficits listed below (see PT Problem List).  Pt will benefit from skilled PT to increase their independence and safety with mobility to allow discharge to the venue listed below.      01/16/15 1029 01/16/15 1035 01/16/15 1040  Vital Signs  Pulse Rate 75 71 82  BP (!) 108/39 mmHg (!) 124/45 mmHg 139/64 mmHg  BP Location Right Arm Right Arm Right Arm  BP Method Automatic Automatic Automatic  Patient Position (if appropriate) Lying Lying Sitting     01/16/15 1049  Vital Signs  Pulse Rate 63  BP (!) 125/47 mmHg  BP Location Right Arm  BP Method Automatic  Patient Position (if appropriate) Standing        Follow Up Recommendations SNF    Equipment Recommendations  3in1 (PT)    Recommendations for Other Services OT consult     Precautions / Restrictions Precautions Precautions: Fall Restrictions Weight Bearing Restrictions: No      Mobility  Bed Mobility Overal bed mobility: Needs Assistance Bed Mobility: Rolling;Sidelying to Sit Rolling: Mod assist Sidelying to sit: Mod assist       General bed mobility comments: Mod assist and hand over hand cueing to reach for rail with rolling; Heavy mod assist to clear feet from EOB and elevate trunk sidelying to sit  Transfers Overall transfer level: Needs assistance Equipment  used: Rolling walker (2 wheeled) Transfers: Sit to/from Stand Sit to Stand: Mod assist         General transfer comment: mod assist to power up; cues for hand placement and to self monitor for activity tolerance  Ambulation/Gait Ambulation/Gait assistance: Mod assist Ambulation Distance (Feet):  (sidesteps to [facility]) Assistive device: Rolling walker (2 wheeled)          Stairs            Wheelchair Mobility    Modified Rankin (Stroke Patients Only)       Balance Overall balance assessment: Needs assistance Sitting-balance support: Bilateral upper extremity supported;Feet supported Sitting balance-Leahy Scale: Poor (close to fair) Sitting balance - Comments: tending to lean back      Standing balance-Leahy Scale: Poor                               Pertinent Vitals/Pain Pain Assessment: No/denies pain    Home Living Family/patient expects to be discharged to:: Unsure Living Arrangements: Alone Available Help at Discharge: Family;Other (Comment) (Daily check ins from son and daughter) Type of Home: House Home Access: Stairs to enter Entrance Stairs-Rails: Right;Left (REports she does not go up/down steps without help) Entrance Stairs-Number of Steps: 4 Home Layout: One level Home Equipment: Walker - 2 wheels;Cane - single point      Prior Function Level of Independence: Independent with assistive device(s);Needs  assistance      ADL's / Homemaking Assistance Needed: Daughter, Nettie Elm voiced concern for Ms. Sieben's ability to bathe safely        Hand Dominance        Extremity/Trunk Assessment   Upper Extremity Assessment: Generalized weakness           Lower Extremity Assessment: Generalized weakness         Communication   Communication: HOH  Cognition Arousal/Alertness: Awake/alert Behavior During Therapy: WFL for tasks assessed/performed Overall Cognitive Status: Within Functional Limits for tasks assessed                       General Comments General comments (skin integrity, edema, etc.): Serials BPs not indicative of Orthostatic hypotension    Exercises        Assessment/Plan    PT Assessment Patient needs continued PT services  PT Diagnosis Difficulty walking;Generalized weakness   PT Problem List Decreased strength;Decreased range of motion;Decreased activity tolerance;Decreased balance;Decreased mobility;Decreased coordination;Decreased knowledge of use of DME;Decreased knowledge of precautions;Decreased safety awareness  PT Treatment Interventions DME instruction;Gait training;Stair training;Functional mobility training;Therapeutic activities;Therapeutic exercise;Balance training;Patient/family education   PT Goals (Current goals can be found in the Care Plan section) Acute Rehab PT Goals Patient Stated Goal: likes being independent PT Goal Formulation: With patient Time For Goal Achievement: 01/30/15 Potential to Achieve Goals: Good    Frequency Min 3X/week   Barriers to discharge Decreased caregiver support Must be at modified independent  functional level to be able to dc home; recommend sNF for rehab    Co-evaluation               End of Session Equipment Utilized During Treatment: Gait belt Activity Tolerance: Patient tolerated treatment well Patient left: in bed;with call bell/phone within reach;Other (comment);with family/visitor present;with bed alarm set (bed in chair position) Nurse Communication: Mobility status    Functional Assessment Tool Used: Clinical Judgement Functional Limitation: Mobility: Walking and moving around Mobility: Walking and Moving Around Current Status (Z6109): At least 40 percent but less than 60 percent impaired, limited or restricted Mobility: Walking and Moving Around Goal Status [phone]): At least 1 percent but less than 20 percent impaired, limited or restricted    Time: 1025-1055 PT Time Calculation (min) (ACUTE ONLY): 30  min   Charges:   PT Evaluation $Initial PT Evaluation Tier I: 1 Procedure PT Treatments $Therapeutic Activity: 8-22 mins   PT G Codes:   PT G-Codes **NOT FOR INPATIENT CLASS** Functional Assessment Tool Used: Clinical Judgement Functional Limitation: Mobility: Walking and moving around Mobility: Walking and Moving Around Current Status (U9811): At least 40 percent but less than 60 percent impaired, limited or restricted Mobility: Walking and Moving Around Goal Status [phone]): At least 1 percent but less than 20 percent impaired, limited or restricted    Van Clines [facility] 01/16/2015, 11:58 AM  Van Clines, PT  Acute Rehabilitation Services Pager [phone] Office [phone]

## 2015-01-16 NOTE — Progress Notes (Signed)
PATIENT DETAILS Name: Mckenzie Jordan Age: 81 y.o. Sex: female Date of Birth: Dec 21, 1920 Admit Date: 01/15/2015 Admitting Physician Ron Parker, MD EPP:IRJJOACZYSA,YTKZS Homero Fellers, MD  Subjective: Admitted with episodes of syncope. Lives alone-uses a cane to ambulate. Currently without complaints-except that she feels weak.  Assessment/Plan: Principal Problem: Syncope: 2 episodes-up with prior to admission. Telemetry overnight reveals second-degree heart block, with a lot of PVCs. Continue to monitor in telemetry, cardiology consult. Keep potassium more than 4, check magnesium.Await Echo  Active Problems:  Acute on chronic disease stage III Suspect prerenal azotemia from diuretics and NSAID use. Continue with IV fluids, HCTZ and NSAIDs currently on hold.  Hypertension: Currently controlled-continue with felodipine. HCTZ remains on hold.  UTI: Start Cipro and await urine cultures.  Pressure ulcer-stage I: Supportive care. Present prior to admission  Disposition: Remain inpatient-home in next 1-2 days  Antimicrobial agents  See below  Anti-infectives    None      DVT Prophylaxis: Prophylactic Lovenox   Code Status: Full code  Family Communication None at bedside  Procedures: None  CONSULTS:  cardiology  Time spent 35 minutes-Greater than 50% of this time was spent in counseling, explanation of diagnosis, planning of further management, and coordination of care.  MEDICATIONS: Scheduled Meds: . enoxaparin (LOVENOX) injection  30 mg Subcutaneous Q24H  . felodipine  10 mg Oral Daily  . [START ON 01/17/2015] pneumococcal 23 valent vaccine  0.5 mL Intramuscular Tomorrow-1000  . potassium chloride  40 mEq Oral Once  . sodium chloride  3 mL Intravenous Q12H   Continuous Infusions: . sodium chloride 75 mL/hr at 01/16/15 0109   PRN Meds:.acetaminophen **OR** acetaminophen, alum & mag hydroxide-simeth, HYDROmorphone (DILAUDID) injection,  ondansetron **OR** ondansetron (ZOFRAN) IV, oxyCODONE    PHYSICAL EXAM: Vital signs in last 24 hours: Filed Vitals:   01/16/15 0430 01/16/15 0815 01/16/15 0918 01/16/15 1029  BP: 106/26 142/98 138/56 108/39  Pulse: 78 64  75  Temp: 98.4 F (36.9 C) 99 F (37.2 C)    TempSrc: Oral Oral    Resp: 16 18    Height:      Weight:      SpO2: 97% 96%      Weight change:  Filed Weights   01/16/15 0047  Weight: 78.019 kg (172 lb)   Body mass index is 27.77 kg/(m^2).   Gen Exam: Awake and alert with clear speech. Looks frail Neck: Supple, No JVD.   Chest: B/L Clear.   CVS: S1 S2 Regular Abdomen: soft, BS +, non tender, non distended.  Extremities: no edema, lower extremities warm to touch. Neurologic: Non Focal.  Skin: No Rash.   Wounds: N/A.    Intake/Output from previous day:  Intake/Output Summary (Last 24 hours) at 01/16/15 1032 Last data filed at 01/16/15 0810  Gross per 24 hour  Intake 476.25 ml  Output    200 ml  Net 276.25 ml     LAB RESULTS: CBC  Recent Labs Lab 01/15/15 2029 01/16/15 0508  WBC 14.4* 11.0*  HGB 12.0 10.5*  HCT 35.8* 31.6*  PLT 260 223  MCV 96.5 96.9  MCH 32.3 32.2  MCHC 33.5 33.2  RDW 17.2* 17.4*  LYMPHSABS 1.2  --   MONOABS 0.6  --   EOSABS 0.0  --   BASOSABS 0.0  --     Chemistries   Recent Labs Lab 01/15/15 2029 01/16/15 0508  NA 138 140  K 4.0 3.7  CL 103 104  CO2 27 26  GLUCOSE 129* 103*  BUN 26* 26*  CREATININE 1.87* 1.69*  CALCIUM 9.3 8.6*    CBG: No results for input(s): GLUCAP in the last 168 hours.  GFR Estimated Creatinine Clearance: 22.9 mL/min (by C-G formula based on Cr of 1.69).  Coagulation profile No results for input(s): INR, PROTIME in the last 168 hours.  Cardiac Enzymes  Recent Labs Lab 01/15/15 2029 01/15/15 2342 01/16/15 0508  TROPONINI 0.06* 0.05* 0.06*    Invalid input(s): POCBNP No results for input(s): DDIMER in the last 72 hours. No results for input(s): HGBA1C in the  last 72 hours. No results for input(s): CHOL, HDL, LDLCALC, TRIG, CHOLHDL, LDLDIRECT in the last 72 hours. No results for input(s): TSH, T4TOTAL, T3FREE, THYROIDAB in the last 72 hours.  Invalid input(s): FREET3 No results for input(s): VITAMINB12, FOLATE, FERRITIN, TIBC, IRON, RETICCTPCT in the last 72 hours. No results for input(s): LIPASE, AMYLASE in the last 72 hours.  Urine Studies No results for input(s): UHGB, CRYS in the last 72 hours.  Invalid input(s): UACOL, UAPR, USPG, UPH, UTP, UGL, UKET, UBIL, UNIT, UROB, ULEU, UEPI, UWBC, URBC, UBAC, CAST, UCOM, BILUA  MICROBIOLOGY: No results found for this or any previous visit (from the past 240 hour(s)).  RADIOLOGY STUDIES/RESULTS: Ct Head Wo Contrast  01/15/2015   CLINICAL DATA:  Syncope and collapse. Found on the floor of this afternoon.  EXAM: CT HEAD WITHOUT CONTRAST  CT CERVICAL SPINE WITHOUT CONTRAST  TECHNIQUE: Multidetector CT imaging of the head and cervical spine was performed following the standard protocol without intravenous contrast. Multiplanar CT image reconstructions of the cervical spine were also generated.  COMPARISON:  None.  FINDINGS: CT HEAD FINDINGS  Age-related atrophy. Mild chronic small vessel ischemic change.No intracranial hemorrhage, mass effect, or midline shift. No hydrocephalus. The basilar cisterns are patent. No evidence of territorial infarct. No intracranial fluid collection. Calvarium is intact. Included paranasal sinuses and mastoid air cells are well aerated.  CT CERVICAL SPINE FINDINGS  Minimal anterolisthesis of C4 on C5 is degenerative, alignment is otherwise maintained. There is no fracture. Vertebral body heights are preserved. The dens is intact. There are no jumped or perched facets. Disc space narrowing at C4-C5 and C5-C6. Multilevel endplate spurs. There is multilevel facet arthropathy. No prevertebral soft tissue edema.  IMPRESSION: 1. No acute intracranial abnormality. Age related atrophy and  mild chronic small vessel ischemia. 2. Multilevel degenerative change throughout cervical spine without acute fracture or subluxation.   Electronically Signed   By: Rubye Oaks M.D.   On: 01/15/2015 21:13   Ct Cervical Spine Wo Contrast  01/15/2015   CLINICAL DATA:  Syncope and collapse. Found on the floor of this afternoon.  EXAM: CT HEAD WITHOUT CONTRAST  CT CERVICAL SPINE WITHOUT CONTRAST  TECHNIQUE: Multidetector CT imaging of the head and cervical spine was performed following the standard protocol without intravenous contrast. Multiplanar CT image reconstructions of the cervical spine were also generated.  COMPARISON:  None.  FINDINGS: CT HEAD FINDINGS  Age-related atrophy. Mild chronic small vessel ischemic change.No intracranial hemorrhage, mass effect, or midline shift. No hydrocephalus. The basilar cisterns are patent. No evidence of territorial infarct. No intracranial fluid collection. Calvarium is intact. Included paranasal sinuses and mastoid air cells are well aerated.  CT CERVICAL SPINE FINDINGS  Minimal anterolisthesis of C4 on C5 is degenerative, alignment is otherwise maintained. There is no fracture. Vertebral body heights are preserved. The dens is intact.  There are no jumped or perched facets. Disc space narrowing at C4-C5 and C5-C6. Multilevel endplate spurs. There is multilevel facet arthropathy. No prevertebral soft tissue edema.  IMPRESSION: 1. No acute intracranial abnormality. Age related atrophy and mild chronic small vessel ischemia. 2. Multilevel degenerative change throughout cervical spine without acute fracture or subluxation.   Electronically Signed   By: Rubye Oaks M.D.   On: 01/15/2015 21:13   Dg Chest Portable 1 View  01/15/2015   CLINICAL DATA:  Unwitnessed fall.  EXAM: PORTABLE CHEST - 1 VIEW  COMPARISON:  None.  FINDINGS: A single AP portable view of the chest demonstrates no focal airspace consolidation or alveolar edema. The lungs are grossly clear. There  is no large effusion or pneumothorax. Cardiac and mediastinal contours appear unremarkable.  IMPRESSION: No active disease.   Electronically Signed   By: Ellery Plunk M.D.   On: 01/15/2015 22:47    Jeoffrey Massed, MD  [facility] Pager:[phone]  If 7PM-7AM, please contact night-coverage [email or web address] Password [facility] 01/16/2015, 10:32 AM

## 2015-01-17 ENCOUNTER — Other Ambulatory Visit (HOSPITAL_COMMUNITY): Payer: Medicare Other

## 2015-01-17 DIAGNOSIS — R55 Syncope and collapse: Secondary | ICD-10-CM | POA: Insufficient documentation

## 2015-01-17 DIAGNOSIS — I493 Ventricular premature depolarization: Secondary | ICD-10-CM

## 2015-01-17 DIAGNOSIS — I10 Essential (primary) hypertension: Secondary | ICD-10-CM | POA: Diagnosis not present

## 2015-01-17 DIAGNOSIS — N179 Acute kidney failure, unspecified: Secondary | ICD-10-CM | POA: Diagnosis not present

## 2015-01-17 LAB — URINE CULTURE: Culture: NO GROWTH

## 2015-01-17 LAB — BASIC METABOLIC PANEL
Anion gap: 10 (ref 5–15)
BUN: 24 mg/dL — ABNORMAL HIGH (ref 6–20)
CHLORIDE: 106 mmol/L (ref 101–111)
CO2: 21 mmol/L — ABNORMAL LOW (ref 22–32)
Calcium: 8.3 mg/dL — ABNORMAL LOW (ref 8.9–10.3)
Creatinine, Ser: 1.58 mg/dL — ABNORMAL HIGH (ref 0.44–1.00)
GFR calc non Af Amer: 27 mL/min — ABNORMAL LOW (ref >60)
GFR, EST AFRICAN AMERICAN: 32 mL/min — AB (ref >60)
Glucose, Bld: 141 mg/dL — ABNORMAL HIGH (ref 65–99)
POTASSIUM: 4 mmol/L (ref 3.5–5.1)
SODIUM: 137 mmol/L (ref 135–145)

## 2015-01-17 MED ORDER — METOPROLOL TARTRATE 25 MG PO TABS
25.0000 mg | ORAL_TABLET | Freq: Two times a day (BID) | ORAL | Status: DC
  Administered 2015-01-17 – 2015-01-18 (×3): 25 mg via ORAL
  Filled 2015-01-17 (×3): qty 1

## 2015-01-17 NOTE — Clinical Social Work Placement (Signed)
   CLINICAL SOCIAL WORK PLACEMENT  NOTE  Date:  01/17/2015  Patient Details  Name: Mckenzie Jordan MRN: [id number] Date of Birth: 03-Oct-1920  Clinical Social Work is seeking post-discharge placement for this patient at the Skilled  Nursing Facility level of care (*CSW will initial, date and re-position this form in  chart as items are completed):  Yes   Patient/family provided with [place] Clinical Social Work Department's list of facilities offering this level of care within the geographic area requested by the patient (or if unable, by the patient's family).  Yes   Patient/family informed of their freedom to choose among providers that offer the needed level of care, that participate in Medicare, Medicaid or managed care program needed by the patient, have an available bed and are willing to accept the patient.  Yes   Patient/family informed of [place]'s ownership interest in [facility] and [facility], as well as of the fact that they are under no obligation to receive care at these facilities.  PASRR submitted to EDS on 01/17/15     PASRR number received on 01/17/15     Existing PASRR number confirmed on       FL2 transmitted to all facilities in geographic area requested by pt/family on 01/17/15     FL2 transmitted to all facilities within larger geographic area on       Patient informed that his/her managed care company has contracts with or will negotiate with certain facilities, including the following:        Yes   Patient/family informed of bed offers received.  Patient chooses bed at [facility]     Physician recommends and patient chooses bed at      Patient to be transferred to   on  .  Patient to be transferred to facility by       Patient family notified on   of transfer.  Name of family member notified:        PHYSICIAN Please sign FL2, Please prepare priority discharge summary, including medications, Please prepare prescriptions, Please sign  DNR     Additional CommentSharol Harness, LCSWA [phone]

## 2015-01-17 NOTE — Progress Notes (Signed)
Physical Therapy Treatment Patient Details Name: Mckenzie Jordan MRN: [id number] DOB: 04-08-1921 Today's Date: 01/17/2015    History of Present Illness HPI: Mckenzie Jordan is a 81 y.o. female with a history of HTN, and Hyperlipidemia who presents tot he ED with 2 episodes of Syncope today. She reports that the first episode occurred when she got up in the AM to go to the Bathroom. The second episode occurred After she was up walking and she was found by her family on the floor and was too weak to get up. Syncope workup in progress    PT Comments    Pt making slow progress with mobility but was able to ambulate very short distance in room with +2A (modA) with use of RW.  Note pt continuing to get orthostatic, but less c/o dizziness and light headedness with upright mobility.  BP in sitting was 135/84 and following gait was 113/43.   Pt states feeling better once seated in recliner.  RN made aware.    Follow Up Recommendations  SNF     Equipment Recommendations  3in1 (PT)    Recommendations for Other Services OT consult     Precautions / Restrictions Precautions Precautions: Fall Precaution Comments: watch BP Restrictions Weight Bearing Restrictions: No    Mobility  Bed Mobility Overal bed mobility: Needs Assistance Bed Mobility: Rolling;Sidelying to Sit Rolling: Mod assist Sidelying to sit: Mod assist       General bed mobility comments: Pt with very slow initiation of movement due to increased pain but was able to roll and sit up with mod A, again with [facility] assist for using bedrail to self assist and max cues for safety and log roll technique.   Transfers Overall transfer level: Needs assistance Equipment used: Rolling walker (2 wheeled) Transfers: Sit to/from [employer] Sit to Stand: +2 physical assistance;Mod assist Stand pivot transfers: +2 physical assistance;Mod assist       General transfer comment: Continues to require mod A for sit<>stand  with max cues for hand placement and for increased forward weight shift, esp once in standing.    Ambulation/Gait Ambulation/Gait assistance: Mod assist;+2 physical assistance Ambulation Distance (Feet): 10 Feet Assistive device: Rolling walker (2 wheeled) Gait Pattern/deviations: Step-through pattern;Shuffle;Decreased stride length;Leaning posteriorly;Trunk flexed     General Gait Details: Pt requires max HOH assist for guiding RW and verbal and tactile cues to maintain position inside of RW.  Also provided facillitation at hip for increased extension due to forward flexed posture.    Stairs            Wheelchair Mobility    Modified Rankin (Stroke Patients Only)       Balance Overall balance assessment: Needs assistance Sitting-balance support: Feet supported;Single extremity supported Sitting balance-Leahy Scale: Fair     Standing balance support: During functional activity;Single extremity supported Standing balance-Leahy Scale: Poor Standing balance comment: Pt was able to stand with single UE support on RW and perform peri care at min A level today during session .                    Cognition Arousal/Alertness: Awake/alert Behavior During Therapy: WFL for tasks assessed/performed Overall Cognitive Status: Within Functional Limits for tasks assessed                      Exercises      General Comments        Pertinent Vitals/Pain Pain Assessment: No/denies pain  Home Living                      Prior Function            PT Goals (current goals can now be found in the care plan section) Acute Rehab PT Goals Patient Stated Goal: likes being independent PT Goal Formulation: With patient Time For Goal Achievement: 01/30/15 Potential to Achieve Goals: Good Progress towards PT goals: Progressing toward goals    Frequency  Min 3X/week    PT Plan Current plan remains appropriate    Co-evaluation             End  of Session Equipment Utilized During Treatment: Gait belt Activity Tolerance: Patient limited by fatigue Patient left: in chair;with call bell/phone within reach;with chair alarm set     Time: 1610-9604 PT Time Calculation (min) (ACUTE ONLY): 30 min  Charges:  $Gait Training: 8-22 mins $Therapeutic Activity: 8-22 mins                    G Codes:  Functional Assessment Tool Used: Clinical Judgement Functional Limitation: Mobility: Walking and moving around Mobility: Walking and Moving Around Current Status ([id number]): At least 40 percent but less than 60 percent impaired, limited or restricted Mobility: Walking and Moving Around Goal Status [phone]): At least 1 percent but less than 20 percent impaired, limited or restricted   Vista Deck 01/17/2015, 12:55 PM

## 2015-01-17 NOTE — Clinical Social Work Note (Signed)
Clinical Social Work Assessment  Patient Details  Name: Mckenzie Jordan MRN: [id number] Date of Birth: Jul 03, 1920  Date of referral:  01/17/15               Reason for consult:  Facility Placement                Permission sought to share information with:  Oceanographer granted to share information::  Yes, Verbal Permission Granted (From family)  Name::        Agency::  SNFs for referral purposes  Relationship::     Contact Information:     Housing/Transportation Living arrangements for the past 2 months:  Single Family Home Source of Information:  Adult Children Patient Interpreter Needed:  None Criminal Activity/Legal Involvement Pertinent to Current Situation/Hospitalization:  No - Comment as needed Significant Relationships:  Adult Children Lives with:  Self Do you feel safe going back to the place where you live?  No (Family concerns for mobility) Need for family participation in patient care:  Yes (Comment)  Care giving concerns:  Recommendation for SNF; pt potentially ready for dc today   Social Worker assessment / plan:  CSW called pt son to discuss SNF recommendation. Pt daughter-in-law Junious Dresser who is also listed as emergency contact answered and informed CSW pt son is unavailable at this time. CSW informed pt daughter-in-law of recommendation and she informed CSW family has spoken and they all agree pt is not in a good condition to return home along at this time. Family does live close by and check on pt multiple times a day, but pt does have long periods of time in which she is alone. Pt family familiar with SNF process and are agreeable to referral being sent to all [facility] with the exception of one facility being left off. Pt family expressed a preference for [facility] or [facility]. They were made aware of potential for dc today.   Employment status:  Retired Database administrator PT Recommendations:  Skilled  Nursing Facility Information / Referral to community resources:  Skilled Nursing Facility  Patient/Family's Response to care:  Pt family in agreement with medical team recommendations for SNF  Patient/Family's Understanding of and Emotional Response to Diagnosis, Current Treatment, and Prognosis:  Pt daughter-in-law has a good understanding of pt condition. Family coping appropriately.  Emotional Assessment Appearance:  Appears stated age, Well-Groomed Attitude/Demeanor/Rapport:  Unable to Assess Affect (typically observed):  Unable to Assess Orientation:  Oriented to Self, Oriented to Place Alcohol / Substance use:  Not Applicable Psych involvement (Current and /or in the community):  No (Comment)  Discharge Needs  Concerns to be addressed:  Discharge Planning Concerns Readmission within the last 30 days:  No Current discharge risk:  Dependent with Mobility Barriers to Discharge:  Continued Medical Work up   [employer] [phone]

## 2015-01-17 NOTE — Progress Notes (Signed)
Notified by CCMD that patient was in second degree heart block. Patient asymptomatic resting in bed. HR in the 70's. Will continue to monitor.

## 2015-01-17 NOTE — Progress Notes (Signed)
PATIENT DETAILS Name: CAMRYNN MCCLINTIC Age: 81 y.o. Sex: female Date of Birth: 1920/08/09 Admit Date: 01/15/2015 Admitting Physician Ron Parker, MD ZOX:[id number] Homero Fellers, MD  Subjective: Complains of some back pain. Otherwise no major complaints.  Assessment/Plan: Principal Problem: Syncope: 2 episodes-up with prior to admission. Telemetry continues to show type I second-degree AV block with PVCs. But syncope felt to be secondary to UTI/dehydration rather than conduction abnormalities at this time.Appreciate cardiology evaluation. Continue to monitor in telemetry, if no further recommendations from cardiology-suspect SNF in a.m.   Active Problems:  Acute on chronic disease stage III Suspect prerenal azotemia from diuretics and NSAID use.Creatinine downtrending with IV fluids and now back to usual baseline  .Continue to hold HCTZ and NSAIDs   Hypertension: Currently controlled-continue with felodipine. HCTZ remains on hold.  UTI: Start Cipro and prelim urine culture neg so far. Suspect 3 day course should be suffice.  Pressure ulcer-stage I: Supportive care. Present prior to admission  Disposition: Remain inpatient-SNF 8/2  Antimicrobial agents  See below  Anti-infectives    Start     Dose/Rate Route Frequency Ordered Stop   01/16/15 1200  ciprofloxacin (CIPRO) tablet 500 mg     500 mg Oral Daily with breakfast 01/16/15 1055        DVT Prophylaxis: Prophylactic Lovenox   Code Status: DNR-reconfirmed with daughter at bedside  Family Communication None at bedside  Procedures: None  CONSULTS:  cardiology  Time spent 20 minutes-Greater than 50% of this time was spent in counseling, explanation of diagnosis, planning of further management, and coordination of care.  MEDICATIONS: Scheduled Meds: . ciprofloxacin  500 mg Oral Q breakfast  . enoxaparin (LOVENOX) injection  30 mg Subcutaneous Q24H  . felodipine  10 mg Oral Daily  .  metoprolol tartrate  25 mg Oral BID  . sodium chloride  3 mL Intravenous Q12H   Continuous Infusions: . sodium chloride 75 mL/hr at 01/17/15 0609   PRN Meds:.acetaminophen **OR** acetaminophen, alum & mag hydroxide-simeth, ondansetron **OR** ondansetron (ZOFRAN) IV, oxyCODONE    PHYSICAL EXAM: Vital signs in last 24 hours: Filed Vitals:   01/17/15 0000 01/17/15 0400 01/17/15 1033 01/17/15 1035  BP: 134/40 134/66 113/54   Pulse: 78 58  90  Temp: 98.9 F (37.2 C) 98.9 F (37.2 C)    TempSrc: Oral Oral    Resp: 18 18    Height:      Weight:  79.334 kg (174 lb 14.4 oz)    SpO2: 93% 95%      Weight change: 1.315 kg (2 lb 14.4 oz) Filed Weights   01/16/15 0047 01/17/15 0400  Weight: 78.019 kg (172 lb) 79.334 kg (174 lb 14.4 oz)   Body mass index is 28.24 kg/(m^2).   Gen Exam: Awake and alert with clear speech. Looks frail Neck: Supple, No JVD.   Chest: B/L Clear.   CVS: S1 S2 Regular Abdomen: soft, BS +, non tender, non distended.  Extremities: no edema, lower extremities warm to touch. Neurologic: Non Focal.  Skin: No Rash.   Wounds: N/A.    Intake/Output from previous day:  Intake/Output Summary (Last 24 hours) at 01/17/15 1127 Last data filed at 01/17/15 1002  Gross per 24 hour  Intake   1645 ml  Output    952 ml  Net    693 ml     LAB RESULTS: CBC  Recent Labs Lab 01/15/15 2029 01/16/15  0508  WBC 14.4* 11.0*  HGB 12.0 10.5*  HCT 35.8* 31.6*  PLT 260 223  MCV 96.5 96.9  MCH 32.3 32.2  MCHC 33.5 33.2  RDW 17.2* 17.4*  LYMPHSABS 1.2  --   MONOABS 0.6  --   EOSABS 0.0  --   BASOSABS 0.0  --     Chemistries   Recent Labs Lab 01/15/15 2029 01/16/15 0508 01/16/15 1025 01/17/15 0840  NA 138 140  --  137  K 4.0 3.7  --  4.0  CL 103 104  --  106  CO2 27 26  --  21*  GLUCOSE 129* 103*  --  141*  BUN 26* 26*  --  24*  CREATININE 1.87* 1.69*  --  1.58*  CALCIUM 9.3 8.6*  --  8.3*  MG  --   --  1.8  --     CBG: No results for input(s):  GLUCAP in the last 168 hours.  GFR Estimated Creatinine Clearance: 24.6 mL/min (by C-G formula based on Cr of 1.58).  Coagulation profile No results for input(s): INR, PROTIME in the last 168 hours.  Cardiac Enzymes  Recent Labs Lab 01/15/15 2342 01/16/15 0508 01/16/15 1120  TROPONINI 0.05* 0.06* 0.05*    Invalid input(s): POCBNP No results for input(s): DDIMER in the last 72 hours. No results for input(s): HGBA1C in the last 72 hours. No results for input(s): CHOL, HDL, LDLCALC, TRIG, CHOLHDL, LDLDIRECT in the last 72 hours. No results for input(s): TSH, T4TOTAL, T3FREE, THYROIDAB in the last 72 hours.  Invalid input(s): FREET3 No results for input(s): VITAMINB12, FOLATE, FERRITIN, TIBC, IRON, RETICCTPCT in the last 72 hours. No results for input(s): LIPASE, AMYLASE in the last 72 hours.  Urine Studies No results for input(s): UHGB, CRYS in the last 72 hours.  Invalid input(s): UACOL, UAPR, USPG, UPH, UTP, UGL, UKET, UBIL, UNIT, UROB, ULEU, UEPI, UWBC, URBC, UBAC, CAST, UCOM, BILUA  MICROBIOLOGY: Recent Results (from the past 240 hour(s))  Urine culture     Status: None   Collection Time: 01/15/15 10:21 PM  Result Value Ref Range Status   Specimen Description URINE, CATHETERIZED  Final   Special Requests NONE  Final   Culture NO GROWTH 2 DAYS  Final   Report Status 01/17/2015 FINAL  Final    RADIOLOGY STUDIES/RESULTS: Ct Head Wo Contrast  01/15/2015   CLINICAL DATA:  Syncope and collapse. Found on the floor of this afternoon.  EXAM: CT HEAD WITHOUT CONTRAST  CT CERVICAL SPINE WITHOUT CONTRAST  TECHNIQUE: Multidetector CT imaging of the head and cervical spine was performed following the standard protocol without intravenous contrast. Multiplanar CT image reconstructions of the cervical spine were also generated.  COMPARISON:  None.  FINDINGS: CT HEAD FINDINGS  Age-related atrophy. Mild chronic small vessel ischemic change.No intracranial hemorrhage, mass effect, or  midline shift. No hydrocephalus. The basilar cisterns are patent. No evidence of territorial infarct. No intracranial fluid collection. Calvarium is intact. Included paranasal sinuses and mastoid air cells are well aerated.  CT CERVICAL SPINE FINDINGS  Minimal anterolisthesis of C4 on C5 is degenerative, alignment is otherwise maintained. There is no fracture. Vertebral body heights are preserved. The dens is intact. There are no jumped or perched facets. Disc space narrowing at C4-C5 and C5-C6. Multilevel endplate spurs. There is multilevel facet arthropathy. No prevertebral soft tissue edema.  IMPRESSION: 1. No acute intracranial abnormality. Age related atrophy and mild chronic small vessel ischemia. 2. Multilevel degenerative change throughout cervical spine without  acute fracture or subluxation.   Electronically Signed   By: Rubye Oaks M.D.   On: 01/15/2015 21:13   Ct Cervical Spine Wo Contrast  01/15/2015   CLINICAL DATA:  Syncope and collapse. Found on the floor of this afternoon.  EXAM: CT HEAD WITHOUT CONTRAST  CT CERVICAL SPINE WITHOUT CONTRAST  TECHNIQUE: Multidetector CT imaging of the head and cervical spine was performed following the standard protocol without intravenous contrast. Multiplanar CT image reconstructions of the cervical spine were also generated.  COMPARISON:  None.  FINDINGS: CT HEAD FINDINGS  Age-related atrophy. Mild chronic small vessel ischemic change.No intracranial hemorrhage, mass effect, or midline shift. No hydrocephalus. The basilar cisterns are patent. No evidence of territorial infarct. No intracranial fluid collection. Calvarium is intact. Included paranasal sinuses and mastoid air cells are well aerated.  CT CERVICAL SPINE FINDINGS  Minimal anterolisthesis of C4 on C5 is degenerative, alignment is otherwise maintained. There is no fracture. Vertebral body heights are preserved. The dens is intact. There are no jumped or perched facets. Disc space narrowing at C4-C5  and C5-C6. Multilevel endplate spurs. There is multilevel facet arthropathy. No prevertebral soft tissue edema.  IMPRESSION: 1. No acute intracranial abnormality. Age related atrophy and mild chronic small vessel ischemia. 2. Multilevel degenerative change throughout cervical spine without acute fracture or subluxation.   Electronically Signed   By: Rubye Oaks M.D.   On: 01/15/2015 21:13   Dg Chest Portable 1 View  01/15/2015   CLINICAL DATA:  Unwitnessed fall.  EXAM: PORTABLE CHEST - 1 VIEW  COMPARISON:  None.  FINDINGS: A single AP portable view of the chest demonstrates no focal airspace consolidation or alveolar edema. The lungs are grossly clear. There is no large effusion or pneumothorax. Cardiac and mediastinal contours appear unremarkable.  IMPRESSION: No active disease.   Electronically Signed   By: Ellery Plunk M.D.   On: 01/15/2015 22:47    Jeoffrey Massed, MD  [facility] Pager:[phone]  If 7PM-7AM, please contact night-coverage [email or web address] Password [email or web address] 01/17/2015, 11:27 AM

## 2015-01-17 NOTE — Progress Notes (Signed)
CSW (Clinical Child psychotherapist) received call from Office manager notifying that pt is approved to dc to SNF. CSW informed insurance rep of plan for dc tomorrow.  Melissaann Dizdarevic, LCSWA [phone]

## 2015-01-17 NOTE — Progress Notes (Signed)
Patient Name: Mckenzie Jordan Date of Encounter: 01/17/2015  Primary Cardiologist: new   Principal Problem:   Syncope Active Problems:   Hyperlipidemia   Essential hypertension   AKI (acute kidney injury)   Leukocytosis   Pressure ulcer    SUBJECTIVE  Denies any CP or SOB. Severe L sided back pain  CURRENT MEDS . ciprofloxacin  500 mg Oral Q breakfast  . enoxaparin (LOVENOX) injection  30 mg Subcutaneous Q24H  . felodipine  10 mg Oral Daily  . pneumococcal 23 valent vaccine  0.5 mL Intramuscular Tomorrow-1000  . sodium chloride  3 mL Intravenous Q12H    OBJECTIVE  Filed Vitals:   01/16/15 1715 01/16/15 2045 01/17/15 0000 01/17/15 0400  BP: 116/71 140/73 134/40 134/66  Pulse: 80 81 78 58  Temp: 98.7 F (37.1 C) 98.3 F (36.8 C) 98.9 F (37.2 C) 98.9 F (37.2 C)  TempSrc: Oral Oral Oral Oral  Resp: 18 18 18 18   Height:      Weight:    174 lb 14.4 oz (79.334 kg)  SpO2: 97% 97% 93% 95%    Intake/Output Summary (Last 24 hours) at 01/17/15 0929 Last data filed at 01/17/15 9562  Gross per 24 hour  Intake   1405 ml  Output    652 ml  Net    753 ml   Filed Weights   01/16/15 0047 01/17/15 0400  Weight: 172 lb (78.019 kg) 174 lb 14.4 oz (79.334 kg)    PHYSICAL EXAM  General: Pleasant, NAD. Neuro: Alert and oriented X 3. Moves all extremities spontaneously. Psych: Normal affect. HEENT:  Normal  Neck: Supple without bruits or JVD. Lungs:  Resp regular and unlabored. R basilar rale.  Heart: RRR no s3, s4, or murmurs. Abdomen: Soft, non-tender, non-distended, BS + x 4.  Extremities: No clubbing, cyanosis or edema. DP/PT/Radials 2+ and equal bilaterally.  Accessory Clinical Findings  CBC  Recent Labs  01/15/15 2029 01/16/15 0508  WBC 14.4* 11.0*  NEUTROABS 12.6*  --   HGB 12.0 10.5*  HCT 35.8* 31.6*  MCV 96.5 96.9  PLT 260 223   Basic Metabolic Panel  Recent Labs  01/15/15 2029 01/16/15 0508 01/16/15 1025  NA 138 140  --   K 4.0 3.7  --     CL 103 104  --   CO2 27 26  --   GLUCOSE 129* 103*  --   BUN 26* 26*  --   CREATININE 1.87* 1.69*  --   CALCIUM 9.3 8.6*  --   MG  --   --  1.8   Liver Function Tests  Recent Labs  01/15/15 2029  AST 72*  ALT 24  ALKPHOS 92  BILITOT 1.3*  PROT 6.2*  ALBUMIN 3.5   Cardiac Enzymes  Recent Labs  01/15/15 2342 01/16/15 0508 01/16/15 1120  TROPONINI 0.05* 0.06* 0.05*    TELE NSR with very frequent PVC, bigeminy and ventricular rhythm    ECG  No new EKG  Echocardiogram  pending    Radiology/Studies  Ct Head Wo Contrast  01/15/2015   CLINICAL DATA:  Syncope and collapse. Found on the floor of this afternoon.  EXAM: CT HEAD WITHOUT CONTRAST  CT CERVICAL SPINE WITHOUT CONTRAST  TECHNIQUE: Multidetector CT imaging of the head and cervical spine was performed following the standard protocol without intravenous contrast. Multiplanar CT image reconstructions of the cervical spine were also generated.  COMPARISON:  None.  FINDINGS: CT HEAD FINDINGS  Age-related atrophy. Mild chronic  small vessel ischemic change.No intracranial hemorrhage, mass effect, or midline shift. No hydrocephalus. The basilar cisterns are patent. No evidence of territorial infarct. No intracranial fluid collection. Calvarium is intact. Included paranasal sinuses and mastoid air cells are well aerated.  CT CERVICAL SPINE FINDINGS  Minimal anterolisthesis of C4 on C5 is degenerative, alignment is otherwise maintained. There is no fracture. Vertebral body heights are preserved. The dens is intact. There are no jumped or perched facets. Disc space narrowing at C4-C5 and C5-C6. Multilevel endplate spurs. There is multilevel facet arthropathy. No prevertebral soft tissue edema.  IMPRESSION: 1. No acute intracranial abnormality. Age related atrophy and mild chronic small vessel ischemia. 2. Multilevel degenerative change throughout cervical spine without acute fracture or subluxation.   Electronically Signed   By:  Rubye Oaks M.D.   On: 01/15/2015 21:13   Ct Cervical Spine Wo Contrast  01/15/2015   CLINICAL DATA:  Syncope and collapse. Found on the floor of this afternoon.  EXAM: CT HEAD WITHOUT CONTRAST  CT CERVICAL SPINE WITHOUT CONTRAST  TECHNIQUE: Multidetector CT imaging of the head and cervical spine was performed following the standard protocol without intravenous contrast. Multiplanar CT image reconstructions of the cervical spine were also generated.  COMPARISON:  None.  FINDINGS: CT HEAD FINDINGS  Age-related atrophy. Mild chronic small vessel ischemic change.No intracranial hemorrhage, mass effect, or midline shift. No hydrocephalus. The basilar cisterns are patent. No evidence of territorial infarct. No intracranial fluid collection. Calvarium is intact. Included paranasal sinuses and mastoid air cells are well aerated.  CT CERVICAL SPINE FINDINGS  Minimal anterolisthesis of C4 on C5 is degenerative, alignment is otherwise maintained. There is no fracture. Vertebral body heights are preserved. The dens is intact. There are no jumped or perched facets. Disc space narrowing at C4-C5 and C5-C6. Multilevel endplate spurs. There is multilevel facet arthropathy. No prevertebral soft tissue edema.  IMPRESSION: 1. No acute intracranial abnormality. Age related atrophy and mild chronic small vessel ischemia. 2. Multilevel degenerative change throughout cervical spine without acute fracture or subluxation.   Electronically Signed   By: Rubye Oaks M.D.   On: 01/15/2015 21:13   Dg Chest Portable 1 View  01/15/2015   CLINICAL DATA:  Unwitnessed fall.  EXAM: PORTABLE CHEST - 1 VIEW  COMPARISON:  None.  FINDINGS: A single AP portable view of the chest demonstrates no focal airspace consolidation or alveolar edema. The lungs are grossly clear. There is no large effusion or pneumothorax. Cardiac and mediastinal contours appear unremarkable.  IMPRESSION: No active disease.   Electronically Signed   By: Ellery Plunk M.D.   On: 01/15/2015 22:47    ASSESSMENT AND PLAN  Mckenzie Jordan is a 81 y.o.female history of HTN, HL admitted with syncope x 2  1. Syncope: likely mediated by hypovolemia  - MD to review telemetry, however likely incidental finding. Thiazide on hold, Cr improving with hydration  - if Echo is normal, unlikely to have further workup.   - unable to assess orthostatic vital sign as she was having difficulty standing. HR increased by 10 when change from lying to sitting position.  2. Frequent ventricular ectopy: does not have classic Vtach look, however very frequent with occasional bursts, denies any CP. Will ask MD to review telemetry. Ectopy does not seems to correlate with symptom, as she denies any recurrent dizziness and syncope since admission  3. Borderline elevated trop   4. Acute on chronic renal insufficiency  5. L sided back pain: UA negative, on  Lester Kinsman PA-C Pager: [phone]  I have personally seen and examined this patient with Azalee Course, PA-C. I agree with the assessment and plan as outlined above. She is having frequent PVCs with bigeminy. Several periods of irregularity which appears to be sinus with PACs, PVCs. Cannot fully exclude atrial fibrillation. Echo today. Would monitor today and add low dose beta blocker.   MCALHANY,CHRISTOPHER 01/17/2015 10:08 AM

## 2015-01-18 ENCOUNTER — Observation Stay (HOSPITAL_COMMUNITY): Payer: Medicare Other

## 2015-01-18 DIAGNOSIS — I493 Ventricular premature depolarization: Secondary | ICD-10-CM | POA: Diagnosis not present

## 2015-01-18 DIAGNOSIS — R55 Syncope and collapse: Secondary | ICD-10-CM

## 2015-01-18 DIAGNOSIS — I10 Essential (primary) hypertension: Secondary | ICD-10-CM | POA: Diagnosis not present

## 2015-01-18 DIAGNOSIS — N179 Acute kidney failure, unspecified: Secondary | ICD-10-CM | POA: Diagnosis not present

## 2015-01-18 LAB — BASIC METABOLIC PANEL
Anion gap: 10 (ref 5–15)
BUN: 23 mg/dL — AB (ref 6–20)
CHLORIDE: 102 mmol/L (ref 101–111)
CO2: 23 mmol/L (ref 22–32)
Calcium: 8.1 mg/dL — ABNORMAL LOW (ref 8.9–10.3)
Creatinine, Ser: 1.52 mg/dL — ABNORMAL HIGH (ref 0.44–1.00)
GFR calc Af Amer: 33 mL/min — ABNORMAL LOW (ref >60)
GFR calc non Af Amer: 29 mL/min — ABNORMAL LOW (ref >60)
Glucose, Bld: 100 mg/dL — ABNORMAL HIGH (ref 65–99)
Potassium: 3.7 mmol/L (ref 3.5–5.1)
Sodium: 135 mmol/L (ref 135–145)

## 2015-01-18 LAB — MAGNESIUM: Magnesium: 1.7 mg/dL (ref 1.7–2.4)

## 2015-01-18 MED ORDER — ACETAMINOPHEN 500 MG PO TABS: 1000.0000 mg | ORAL_TABLET | Freq: Three times a day (TID) | ORAL | Status: AC | PRN

## 2015-01-18 MED ORDER — MAGNESIUM SULFATE 2 GM/50ML IV SOLN
2.0000 g | Freq: Once | INTRAVENOUS | Status: AC
  Administered 2015-01-18: 2 g via INTRAVENOUS
  Filled 2015-01-18: qty 50

## 2015-01-18 MED ORDER — POTASSIUM CHLORIDE CRYS ER 20 MEQ PO TBCR
40.0000 meq | EXTENDED_RELEASE_TABLET | Freq: Once | ORAL | Status: AC
  Administered 2015-01-18: 40 meq via ORAL
  Filled 2015-01-18: qty 2

## 2015-01-18 MED ORDER — METOPROLOL TARTRATE 25 MG PO TABS: 25.0000 mg | ORAL_TABLET | Freq: Two times a day (BID) | ORAL | Status: AC

## 2015-01-18 NOTE — Discharge Summary (Signed)
PATIENT DETAILS Name: Mckenzie Jordan Age: 81 y.o. Sex: female Date of Birth: [birth year] MRN: [id number]. Admitting Physician: Ron Parker, MD JXB:[id number] Homero Fellers, MD  Admit Date: 01/15/2015 Discharge date: 01/18/2015  Recommendations for Outpatient Follow-up:  1. Check chemistries in 1 week  2. Avoid NSAIDs-has CKD  3. Has pain from arthritis in numerous joints-try Tylenol-if no significant improvement-may need low-dose prednisone.  PRIMARY DISCHARGE DIAGNOSIS:  Principal Problem:   Syncope Active Problems:   Hyperlipidemia   Essential hypertension   AKI (acute kidney injury)   Leukocytosis   Pressure ulcer   Syncope and collapse      PAST MEDICAL HISTORY: Past Medical History  Diagnosis Date  . HYPERLIPIDEMIA 02/13/2007  . HYPERTENSION 02/13/2007  . OSTEOARTHRITIS 02/13/2007  . DVT (deep venous thrombosis)     remote hx of  . Obesity   . Chronic venous insufficiency   . Kidney stone     DISCHARGE MEDICATIONS: Current Discharge Medication List    START taking these medications   Details  acetaminophen (TYLENOL) 500 MG tablet Take 2 tablets (1,000 mg total) by mouth every 8 (eight) hours as needed for mild pain (or Fever >/= 101).    metoprolol tartrate (LOPRESSOR) 25 MG tablet Take 1 tablet (25 mg total) by mouth 2 (two) times daily.      CONTINUE these medications which have NOT CHANGED   Details  felodipine (PLENDIL) 10 MG 24 hr tablet TAKE 1 TABLET BY MOUTH EVERY DAY Qty: 90 tablet, Refills: 1      STOP taking these medications     hydrochlorothiazide (HYDRODIURIL) 25 MG tablet      naproxen sodium (ALEVE) 220 MG tablet         ALLERGIES:   Allergies  Allergen Reactions  . Carbamazepine Other (See Comments)    Family was not aware of this reaction  . Amoxicillin Rash  . Cephalexin Rash    BRIEF HPI:  See H&P, Labs, Consult and Test reports for all details in brief, patient was admitted for evaluation of  syncope  CONSULTATIONS:   cardiology  PERTINENT RADIOLOGIC STUDIES: Ct Head Wo Contrast  01/15/2015   CLINICAL DATA:  Syncope and collapse. Found on the floor of this afternoon.  EXAM: CT HEAD WITHOUT CONTRAST  CT CERVICAL SPINE WITHOUT CONTRAST  TECHNIQUE: Multidetector CT imaging of the head and cervical spine was performed following the standard protocol without intravenous contrast. Multiplanar CT image reconstructions of the cervical spine were also generated.  COMPARISON:  None.  FINDINGS: CT HEAD FINDINGS  Age-related atrophy. Mild chronic small vessel ischemic change.No intracranial hemorrhage, mass effect, or midline shift. No hydrocephalus. The basilar cisterns are patent. No evidence of territorial infarct. No intracranial fluid collection. Calvarium is intact. Included paranasal sinuses and mastoid air cells are well aerated.  CT CERVICAL SPINE FINDINGS  Minimal anterolisthesis of C4 on C5 is degenerative, alignment is otherwise maintained. There is no fracture. Vertebral body heights are preserved. The dens is intact. There are no jumped or perched facets. Disc space narrowing at C4-C5 and C5-C6. Multilevel endplate spurs. There is multilevel facet arthropathy. No prevertebral soft tissue edema.  IMPRESSION: 1. No acute intracranial abnormality. Age related atrophy and mild chronic small vessel ischemia. 2. Multilevel degenerative change throughout cervical spine without acute fracture or subluxation.   Electronically Signed   By: Rubye Oaks M.D.   On: 01/15/2015 21:13   Ct Cervical Spine Wo Contrast  01/15/2015   CLINICAL DATA:  Syncope and collapse.  Found on the floor of this afternoon.  EXAM: CT HEAD WITHOUT CONTRAST  CT CERVICAL SPINE WITHOUT CONTRAST  TECHNIQUE: Multidetector CT imaging of the head and cervical spine was performed following the standard protocol without intravenous contrast. Multiplanar CT image reconstructions of the cervical spine were also generated.  COMPARISON:   None.  FINDINGS: CT HEAD FINDINGS  Age-related atrophy. Mild chronic small vessel ischemic change.No intracranial hemorrhage, mass effect, or midline shift. No hydrocephalus. The basilar cisterns are patent. No evidence of territorial infarct. No intracranial fluid collection. Calvarium is intact. Included paranasal sinuses and mastoid air cells are well aerated.  CT CERVICAL SPINE FINDINGS  Minimal anterolisthesis of C4 on C5 is degenerative, alignment is otherwise maintained. There is no fracture. Vertebral body heights are preserved. The dens is intact. There are no jumped or perched facets. Disc space narrowing at C4-C5 and C5-C6. Multilevel endplate spurs. There is multilevel facet arthropathy. No prevertebral soft tissue edema.  IMPRESSION: 1. No acute intracranial abnormality. Age related atrophy and mild chronic small vessel ischemia. 2. Multilevel degenerative change throughout cervical spine without acute fracture or subluxation.   Electronically Signed   By: Rubye Oaks M.D.   On: 01/15/2015 21:13   Dg Chest Portable 1 View  01/15/2015   CLINICAL DATA:  Unwitnessed fall.  EXAM: PORTABLE CHEST - 1 VIEW  COMPARISON:  None.  FINDINGS: A single AP portable view of the chest demonstrates no focal airspace consolidation or alveolar edema. The lungs are grossly clear. There is no large effusion or pneumothorax. Cardiac and mediastinal contours appear unremarkable.  IMPRESSION: No active disease.   Electronically Signed   By: Ellery Plunk M.D.   On: 01/15/2015 22:47     PERTINENT LAB RESULTS: CBC:  Recent Labs  01/15/15 2029 01/16/15 0508  WBC 14.4* 11.0*  HGB 12.0 10.5*  HCT 35.8* 31.6*  PLT 260 223   CMET CMP     Component Value Date/Time   NA 135 01/18/2015 0438   K 3.7 01/18/2015 0438   CL 102 01/18/2015 0438   CO2 23 01/18/2015 0438   GLUCOSE 100* 01/18/2015 0438   GLUCOSE 103* 04/02/2006 1059   BUN 23* 01/18/2015 0438   CREATININE 1.52* 01/18/2015 0438   CALCIUM  8.1* 01/18/2015 0438   PROT 6.2* 01/15/2015 2029   ALBUMIN 3.5 01/15/2015 2029   AST 72* 01/15/2015 2029   ALT 24 01/15/2015 2029   ALKPHOS 92 01/15/2015 2029   BILITOT 1.3* 01/15/2015 2029   GFRNONAA 29* 01/18/2015 0438   GFRAA 33* 01/18/2015 0438    GFR Estimated Creatinine Clearance: 25.7 mL/min (by C-G formula based on Cr of 1.52). No results for input(s): LIPASE, AMYLASE in the last 72 hours.  Recent Labs  01/15/15 2342 01/16/15 0508 01/16/15 1120  TROPONINI 0.05* 0.06* 0.05*   Invalid input(s): POCBNP No results for input(s): DDIMER in the last 72 hours. No results for input(s): HGBA1C in the last 72 hours. No results for input(s): CHOL, HDL, LDLCALC, TRIG, CHOLHDL, LDLDIRECT in the last 72 hours. No results for input(s): TSH, T4TOTAL, T3FREE, THYROIDAB in the last 72 hours.  Invalid input(s): FREET3 No results for input(s): VITAMINB12, FOLATE, FERRITIN, TIBC, IRON, RETICCTPCT in the last 72 hours. Coags: No results for input(s): INR in the last 72 hours.  Invalid input(s): PT Microbiology: Recent Results (from the past 240 hour(s))  Urine culture     Status: None   Collection Time: 01/15/15 10:21 PM  Result Value Ref Range Status   Specimen  Description URINE, CATHETERIZED  Final   Special Requests NONE  Final   Culture NO GROWTH 2 DAYS  Final   Report Status 01/17/2015 FINAL  Final     BRIEF HOSPITAL COURSE:  Syncope: 2 episodes-up with prior to admission. Telemetry showed intermittent type I second-degree AV block with a lot of PVCs. But syncope felt to be secondary to UTI/dehydration rather than conduction abnormalities at this time.Evaluated by cardiology, started on low-dose beta blocker-subsequently less frequent PVCs. Spoke with [facility]-okay to discharge to SNF, no further workup recommended at this time. Stable to discharge for SNF today-cautiously continue with beta blocker for now.  Active Problems: Acute on chronic disease stage III Suspect  prerenal azotemia from diuretics and NSAID use.Creatinine downtrending with IV fluids and now back to usual baseline .Continue to hold HCTZ and NSAIDs -would avoid such agents in the future  Hypertension: Currently controlled-continue with felodipine.   UTI: Started ciprofloxacin, however urine cultures negative. No need for antibiotics on discharge  Pressure ulcer-stage I: Supportive care. Present prior to admission  Osteoarthritis:per family (daughter-in-law) patient has chronic back pain, knee pain, ankle pain-she was on NSAIDs prior to admission-given CKD we have discontinued NSAIDs and started on aspirin.  TODAY-DAY OF DISCHARGE:  Subjective:   Reylene Stauder today has no headache,no chest abdominal pain,no new weakness tingling or numbness-patient is  Objective:   Blood pressure 129/50, pulse 92, temperature 98.9 F (37.2 C), temperature source Oral, resp. rate 19, height 5\' 6"  (1.676 m), weight 80.1 kg (176 lb 9.4 oz), SpO2 94 %.  Intake/Output Summary (Last 24 hours) at 01/18/15 1049 Last data filed at 01/17/15 2107  Gross per 24 hour  Intake 163.33 ml  Output      0 ml  Net 163.33 ml   Filed Weights   01/16/15 0047 01/17/15 0400 01/18/15 0352  Weight: 78.019 kg (172 lb) 79.334 kg (174 lb 14.4 oz) 80.1 kg (176 lb 9.4 oz)    Exam Awake Alert, Oriented *3, No new F.N deficits, Normal affect [place].AT,PERRAL Supple Neck,No JVD, No cervical lymphadenopathy appriciated.  Symmetrical Chest wall movement, Good air movement bilaterally, CTAB RRR,No Gallops,Rubs or new Murmurs, No Parasternal Heave +ve B.Sounds, Abd Soft, Non tender, No organomegaly appriciated, No rebound -guarding or rigidity. No Cyanosis, Clubbing or edema, No new Rash or bruise  DISCHARGE CONDITION: Stable  DISPOSITION: SNF   DISCHARGE INSTRUCTIONS:    Activity:  As tolerated with Full fall precautions use walker/cane & assistance as needed  Diet recommendation: eart Healthy diet  Discharge  Instructions    Call MD for:  severe uncontrolled pain    Complete by:  As directed      Diet - low sodium heart healthy    Complete by:  As directed      Increase activity slowly    Complete by:  As directed            Follow-up Information    Follow up with Rogelia Boga, MD. Schedule an appointment as soon as possible for a visit in 1 week.   Specialty:  Internal Medicine   Contact information:   [address] Christena Flake [place] [address] [phone]       Total Time spent on discharge equals . SignedJeoffrey Massed 01/18/2015 10:49 AM

## 2015-01-18 NOTE — Progress Notes (Signed)
  Echocardiogram 2D Echocardiogram has been performed.  [name] 01/18/2015, 10:58 AM

## 2015-01-18 NOTE — Clinical Social Work Placement (Signed)
   CLINICAL SOCIAL WORK PLACEMENT  NOTE  Date:  01/18/2015  Patient Details  Name: Mckenzie Jordan MRN: [id number] Date of Birth: [birth year]  Clinical Social Work is seeking post-discharge placement for this patient at the Skilled  Nursing Facility level of care (*CSW will initial, date and re-position this form in  chart as items are completed):  Yes   Patient/family provided with [facility] Clinical Social Work Department's list of facilities offering this level of care within the geographic area requested by the patient (or if unable, by the patient's family).  Yes   Patient/family informed of their freedom to choose among providers that offer the needed level of care, that participate in Medicare, Medicaid or managed care program needed by the patient, have an available bed and are willing to accept the patient.  Yes   Patient/family informed of [facility]'s ownership interest in [facility] and [facility], as well as of the fact that they are under no obligation to receive care at these facilities.  PASRR submitted to EDS on 01/17/15     PASRR number received on 01/17/15     Existing PASRR number confirmed on       FL2 transmitted to all facilities in geographic area requested by pt/family on 01/17/15     FL2 transmitted to all facilities within larger geographic area on       Patient informed that his/her managed care company has contracts with or will negotiate with certain facilities, including the following:        Yes   Patient/family informed of bed offers received.  Patient chooses bed at [facility]     Physician recommends and patient chooses bed at      Patient to be transferred to [facility] on 01/18/15.  Patient to be transferred to facility by [facility]     Patient family notified on 01/18/15 of transfer.  Name of family member notified:  Mackenize Delgadillo (daughter-in-law)     PHYSICIAN       Additional Comment:     _______________________________________________ Sharol Harness, Theresia Majors [phone]

## 2015-01-18 NOTE — Progress Notes (Signed)
     SUBJECTIVE: No complaints.   BP 129/50 mmHg  Pulse 92  Temp(Src) 98.9 F (37.2 C) (Oral)  Resp 19  Ht  (1.676 m)  Wt 176 lb 9.4 oz (80.1 kg)  BMI 28.52 kg/m2  SpO2 94%  Intake/Output Summary (Last 24 hours) at 01/18/15 0932 Last data filed at 01/17/15 2107  Gross per 24 hour  Intake 403.33 ml  Output    300 ml  Net 103.33 ml    PHYSICAL EXAM General: Well developed, well nourished, in no acute distress. Alert and oriented x 3.  Psych:  Good affect, responds appropriately Neck: No JVD. No masses noted.  Lungs: Clear bilaterally with no wheezes or rhonci noted.  Heart: RRR with no murmurs noted. Abdomen: Bowel sounds are present. Soft, non-tender.  Extremities: No lower extremity edema.   LABS: Basic Metabolic Panel:  Recent Labs  16/10/96 1025 01/17/15 0840 01/18/15 0438  NA  --  137 135  K  --  4.0 3.7  CL  --  106 102  CO2  --  21* 23  GLUCOSE  --  141* 100*  BUN  --  24* 23*  CREATININE  --  1.58* 1.52*  CALCIUM  --  8.3* 8.1*  MG 1.8  --  1.7   CBC:  Recent Labs  01/15/15 2029 01/16/15 0508  WBC 14.4* 11.0*  NEUTROABS 12.6*  --   HGB 12.0 10.5*  HCT 35.8* 31.6*  MCV 96.5 96.9  PLT 260 223   Cardiac Enzymes:  Recent Labs  01/15/15 2342 01/16/15 0508 01/16/15 1120  TROPONINI 0.05* 0.06* 0.05*   Fasting Lipid Panel: No results for input(s): CHOL, HDL, LDLCALC, TRIG, CHOLHDL, LDLDIRECT in the last 72 hours.  Current Meds: . ciprofloxacin  500 mg Oral Q breakfast  . enoxaparin (LOVENOX) injection  30 mg Subcutaneous Q24H  . felodipine  10 mg Oral Daily  . magnesium sulfate 1 - 4 g bolus IVPB  2 g Intravenous Once  . metoprolol tartrate  25 mg Oral BID  . sodium chloride  3 mL Intravenous Q12H     ASSESSMENT AND PLAN:  1. Syncope: Likely mediated by hypovolemia. No recurrence.   2. Frequent ventricular ectopy: beta blocker added. PVCs less frequent.   Mckenzie Jordan  8/2/20169:32 AM

## 2015-01-18 NOTE — Progress Notes (Signed)
CSW (Clinical Social Worker) prepared pt dc packet and placed with shadow chart. CSW arranged non-emergent ambulance transport. Pt, pt family, pt nurse, and facility informed. CSW signing off.  Johnattan Strassman, LCSWA [phone]  

## 2015-02-17 ENCOUNTER — Other Ambulatory Visit: Payer: Self-pay | Admitting: Geriatric Medicine

## 2015-02-17 ENCOUNTER — Ambulatory Visit
Admission: RE | Admit: 2015-02-17 | Discharge: 2015-02-17 | Disposition: A | Discharge status: Home / Self Care | Payer: Medicare Other | Source: Ambulatory Visit | Attending: Geriatric Medicine | Admitting: Geriatric Medicine

## 2015-02-17 DIAGNOSIS — M545 Low back pain: Secondary | ICD-10-CM

## 2015-02-20 ENCOUNTER — Other Ambulatory Visit: Payer: Self-pay | Admitting: Internal Medicine

## 2015-05-14 ENCOUNTER — Other Ambulatory Visit: Payer: Self-pay | Admitting: Internal Medicine

## 2015-07-25 DIAGNOSIS — N390 Urinary tract infection, site not specified: Secondary | ICD-10-CM | POA: Diagnosis not present

## 2015-08-19 DIAGNOSIS — D510 Vitamin B12 deficiency anemia due to intrinsic factor deficiency: Secondary | ICD-10-CM | POA: Diagnosis not present

## 2015-08-19 DIAGNOSIS — M170 Bilateral primary osteoarthritis of knee: Secondary | ICD-10-CM | POA: Diagnosis not present

## 2015-08-19 DIAGNOSIS — N183 Chronic kidney disease, stage 3 (moderate): Secondary | ICD-10-CM | POA: Diagnosis not present

## 2015-08-19 DIAGNOSIS — I129 Hypertensive chronic kidney disease with stage 1 through stage 4 chronic kidney disease, or unspecified chronic kidney disease: Secondary | ICD-10-CM | POA: Diagnosis not present

## 2015-09-21 DIAGNOSIS — H353132 Nonexudative age-related macular degeneration, bilateral, intermediate dry stage: Secondary | ICD-10-CM | POA: Diagnosis not present

## 2015-09-21 DIAGNOSIS — Z961 Presence of intraocular lens: Secondary | ICD-10-CM | POA: Diagnosis not present

## 2015-10-21 DIAGNOSIS — F322 Major depressive disorder, single episode, severe without psychotic features: Secondary | ICD-10-CM | POA: Diagnosis not present

## 2015-10-21 DIAGNOSIS — N183 Chronic kidney disease, stage 3 (moderate): Secondary | ICD-10-CM | POA: Diagnosis not present

## 2015-10-21 DIAGNOSIS — I129 Hypertensive chronic kidney disease with stage 1 through stage 4 chronic kidney disease, or unspecified chronic kidney disease: Secondary | ICD-10-CM | POA: Diagnosis not present

## 2015-10-21 DIAGNOSIS — M170 Bilateral primary osteoarthritis of knee: Secondary | ICD-10-CM | POA: Diagnosis not present

## 2015-12-13 DIAGNOSIS — M13861 Other specified arthritis, right knee: Secondary | ICD-10-CM | POA: Diagnosis not present

## 2015-12-13 DIAGNOSIS — M174 Other bilateral secondary osteoarthritis of knee: Secondary | ICD-10-CM | POA: Diagnosis not present

## 2015-12-13 DIAGNOSIS — M545 Low back pain: Secondary | ICD-10-CM | POA: Diagnosis not present

## 2015-12-13 DIAGNOSIS — M25562 Pain in left knee: Secondary | ICD-10-CM | POA: Diagnosis not present

## 2015-12-13 DIAGNOSIS — M6281 Muscle weakness (generalized): Secondary | ICD-10-CM | POA: Diagnosis not present

## 2015-12-19 DIAGNOSIS — M174 Other bilateral secondary osteoarthritis of knee: Secondary | ICD-10-CM | POA: Diagnosis not present

## 2015-12-19 DIAGNOSIS — M6281 Muscle weakness (generalized): Secondary | ICD-10-CM | POA: Diagnosis not present

## 2015-12-19 DIAGNOSIS — M13861 Other specified arthritis, right knee: Secondary | ICD-10-CM | POA: Diagnosis not present

## 2015-12-19 DIAGNOSIS — M545 Low back pain: Secondary | ICD-10-CM | POA: Diagnosis not present

## 2015-12-19 DIAGNOSIS — M25562 Pain in left knee: Secondary | ICD-10-CM | POA: Diagnosis not present

## 2015-12-24 DIAGNOSIS — R319 Hematuria, unspecified: Secondary | ICD-10-CM | POA: Diagnosis not present

## 2015-12-24 DIAGNOSIS — N390 Urinary tract infection, site not specified: Secondary | ICD-10-CM | POA: Diagnosis not present

## 2015-12-28 DIAGNOSIS — F329 Major depressive disorder, single episode, unspecified: Secondary | ICD-10-CM | POA: Diagnosis not present

## 2015-12-28 DIAGNOSIS — M25561 Pain in right knee: Secondary | ICD-10-CM | POA: Diagnosis not present

## 2015-12-28 DIAGNOSIS — I129 Hypertensive chronic kidney disease with stage 1 through stage 4 chronic kidney disease, or unspecified chronic kidney disease: Secondary | ICD-10-CM | POA: Diagnosis not present

## 2015-12-28 DIAGNOSIS — N183 Chronic kidney disease, stage 3 (moderate): Secondary | ICD-10-CM | POA: Diagnosis not present

## 2016-01-28 DIAGNOSIS — N390 Urinary tract infection, site not specified: Secondary | ICD-10-CM | POA: Diagnosis not present

## 2016-02-08 DIAGNOSIS — I129 Hypertensive chronic kidney disease with stage 1 through stage 4 chronic kidney disease, or unspecified chronic kidney disease: Secondary | ICD-10-CM | POA: Diagnosis not present

## 2016-02-08 DIAGNOSIS — F325 Major depressive disorder, single episode, in full remission: Secondary | ICD-10-CM | POA: Diagnosis not present

## 2016-02-08 DIAGNOSIS — G301 Alzheimer's disease with late onset: Secondary | ICD-10-CM | POA: Diagnosis not present

## 2016-02-08 DIAGNOSIS — N183 Chronic kidney disease, stage 3 (moderate): Secondary | ICD-10-CM | POA: Diagnosis not present

## 2016-02-09 DIAGNOSIS — R7989 Other specified abnormal findings of blood chemistry: Secondary | ICD-10-CM | POA: Diagnosis not present

## 2016-02-10 DIAGNOSIS — Z79899 Other long term (current) drug therapy: Secondary | ICD-10-CM | POA: Diagnosis not present

## 2016-02-10 DIAGNOSIS — N390 Urinary tract infection, site not specified: Secondary | ICD-10-CM | POA: Diagnosis not present

## 2016-02-10 DIAGNOSIS — R319 Hematuria, unspecified: Secondary | ICD-10-CM | POA: Diagnosis not present

## 2016-03-26 DIAGNOSIS — G301 Alzheimer's disease with late onset: Secondary | ICD-10-CM | POA: Diagnosis not present

## 2016-04-13 DIAGNOSIS — I10 Essential (primary) hypertension: Secondary | ICD-10-CM | POA: Diagnosis not present

## 2016-04-13 DIAGNOSIS — R609 Edema, unspecified: Secondary | ICD-10-CM | POA: Diagnosis not present

## 2016-07-14 DIAGNOSIS — J209 Acute bronchitis, unspecified: Secondary | ICD-10-CM | POA: Diagnosis not present

## 2016-07-14 DIAGNOSIS — I517 Cardiomegaly: Secondary | ICD-10-CM | POA: Diagnosis not present

## 2016-07-14 DIAGNOSIS — R05 Cough: Secondary | ICD-10-CM | POA: Diagnosis not present

## 2016-07-22 DIAGNOSIS — J209 Acute bronchitis, unspecified: Secondary | ICD-10-CM | POA: Diagnosis not present

## 2016-07-22 DIAGNOSIS — G301 Alzheimer's disease with late onset: Secondary | ICD-10-CM | POA: Diagnosis not present

## 2016-07-22 DIAGNOSIS — I10 Essential (primary) hypertension: Secondary | ICD-10-CM | POA: Diagnosis not present

## 2016-08-22 DIAGNOSIS — H353132 Nonexudative age-related macular degeneration, bilateral, intermediate dry stage: Secondary | ICD-10-CM | POA: Diagnosis not present

## 2016-08-22 DIAGNOSIS — Z961 Presence of intraocular lens: Secondary | ICD-10-CM | POA: Diagnosis not present

## 2016-10-02 DIAGNOSIS — G8929 Other chronic pain: Secondary | ICD-10-CM | POA: Diagnosis not present

## 2016-10-02 DIAGNOSIS — I10 Essential (primary) hypertension: Secondary | ICD-10-CM | POA: Diagnosis not present

## 2016-10-02 DIAGNOSIS — R41841 Cognitive communication deficit: Secondary | ICD-10-CM | POA: Diagnosis not present

## 2016-11-27 DIAGNOSIS — F0390 Unspecified dementia without behavioral disturbance: Secondary | ICD-10-CM | POA: Diagnosis not present

## 2016-11-27 DIAGNOSIS — M545 Low back pain: Secondary | ICD-10-CM | POA: Diagnosis not present

## 2016-11-27 DIAGNOSIS — I10 Essential (primary) hypertension: Secondary | ICD-10-CM | POA: Diagnosis not present

## 2016-11-27 DIAGNOSIS — E43 Unspecified severe protein-calorie malnutrition: Secondary | ICD-10-CM | POA: Diagnosis not present

## 2017-01-10 DIAGNOSIS — M545 Low back pain: Secondary | ICD-10-CM | POA: Diagnosis not present

## 2017-01-28 DIAGNOSIS — G301 Alzheimer's disease with late onset: Secondary | ICD-10-CM | POA: Diagnosis not present

## 2017-01-28 DIAGNOSIS — I10 Essential (primary) hypertension: Secondary | ICD-10-CM | POA: Diagnosis not present

## 2017-01-29 DIAGNOSIS — I10 Essential (primary) hypertension: Secondary | ICD-10-CM | POA: Diagnosis not present

## 2017-01-29 DIAGNOSIS — D649 Anemia, unspecified: Secondary | ICD-10-CM | POA: Diagnosis not present

## 2017-03-18 DIAGNOSIS — M6281 Muscle weakness (generalized): Secondary | ICD-10-CM | POA: Diagnosis not present

## 2017-03-18 DIAGNOSIS — G8929 Other chronic pain: Secondary | ICD-10-CM | POA: Diagnosis not present

## 2017-03-18 DIAGNOSIS — I10 Essential (primary) hypertension: Secondary | ICD-10-CM | POA: Diagnosis not present

## 2017-03-18 DIAGNOSIS — M159 Polyosteoarthritis, unspecified: Secondary | ICD-10-CM | POA: Diagnosis not present

## 2017-03-25 DIAGNOSIS — M159 Polyosteoarthritis, unspecified: Secondary | ICD-10-CM | POA: Diagnosis not present

## 2017-03-25 DIAGNOSIS — G8929 Other chronic pain: Secondary | ICD-10-CM | POA: Diagnosis not present

## 2017-03-25 DIAGNOSIS — M6281 Muscle weakness (generalized): Secondary | ICD-10-CM | POA: Diagnosis not present

## 2017-03-25 DIAGNOSIS — I10 Essential (primary) hypertension: Secondary | ICD-10-CM | POA: Diagnosis not present

## 2017-04-18 DIAGNOSIS — I10 Essential (primary) hypertension: Secondary | ICD-10-CM | POA: Diagnosis not present

## 2017-04-18 DIAGNOSIS — M159 Polyosteoarthritis, unspecified: Secondary | ICD-10-CM | POA: Diagnosis not present

## 2017-04-18 DIAGNOSIS — M6281 Muscle weakness (generalized): Secondary | ICD-10-CM | POA: Diagnosis not present

## 2017-04-18 DIAGNOSIS — G8929 Other chronic pain: Secondary | ICD-10-CM | POA: Diagnosis not present

## 2017-04-25 DIAGNOSIS — M25562 Pain in left knee: Secondary | ICD-10-CM | POA: Diagnosis not present

## 2017-04-25 DIAGNOSIS — G894 Chronic pain syndrome: Secondary | ICD-10-CM | POA: Diagnosis not present

## 2017-05-20 DIAGNOSIS — H3530 Unspecified macular degeneration: Secondary | ICD-10-CM | POA: Diagnosis not present

## 2017-05-20 DIAGNOSIS — M6281 Muscle weakness (generalized): Secondary | ICD-10-CM | POA: Diagnosis not present

## 2017-05-20 DIAGNOSIS — I10 Essential (primary) hypertension: Secondary | ICD-10-CM | POA: Diagnosis not present

## 2017-05-20 DIAGNOSIS — D509 Iron deficiency anemia, unspecified: Secondary | ICD-10-CM | POA: Diagnosis not present

## 2017-05-30 DIAGNOSIS — M1990 Unspecified osteoarthritis, unspecified site: Secondary | ICD-10-CM | POA: Diagnosis not present

## 2017-05-30 DIAGNOSIS — G894 Chronic pain syndrome: Secondary | ICD-10-CM | POA: Diagnosis not present

## 2017-06-07 DIAGNOSIS — H9213 Otorrhea, bilateral: Secondary | ICD-10-CM | POA: Diagnosis not present

## 2017-06-21 DIAGNOSIS — E785 Hyperlipidemia, unspecified: Secondary | ICD-10-CM | POA: Diagnosis not present

## 2017-06-21 DIAGNOSIS — K5900 Constipation, unspecified: Secondary | ICD-10-CM | POA: Diagnosis not present

## 2017-06-21 DIAGNOSIS — E539 Vitamin B deficiency, unspecified: Secondary | ICD-10-CM | POA: Diagnosis not present

## 2017-06-21 DIAGNOSIS — M6281 Muscle weakness (generalized): Secondary | ICD-10-CM | POA: Diagnosis not present

## 2017-07-01 DIAGNOSIS — K5900 Constipation, unspecified: Secondary | ICD-10-CM | POA: Diagnosis not present

## 2017-07-01 DIAGNOSIS — E785 Hyperlipidemia, unspecified: Secondary | ICD-10-CM | POA: Diagnosis not present

## 2017-07-01 DIAGNOSIS — E539 Vitamin B deficiency, unspecified: Secondary | ICD-10-CM | POA: Diagnosis not present

## 2017-07-01 DIAGNOSIS — M6281 Muscle weakness (generalized): Secondary | ICD-10-CM | POA: Diagnosis not present

## 2017-07-02 DIAGNOSIS — I10 Essential (primary) hypertension: Secondary | ICD-10-CM | POA: Diagnosis not present

## 2017-07-02 DIAGNOSIS — D649 Anemia, unspecified: Secondary | ICD-10-CM | POA: Diagnosis not present

## 2017-07-02 DIAGNOSIS — G894 Chronic pain syndrome: Secondary | ICD-10-CM | POA: Diagnosis not present

## 2017-07-23 DIAGNOSIS — H3530 Unspecified macular degeneration: Secondary | ICD-10-CM | POA: Diagnosis not present

## 2017-07-23 DIAGNOSIS — I10 Essential (primary) hypertension: Secondary | ICD-10-CM | POA: Diagnosis not present

## 2017-07-23 DIAGNOSIS — D509 Iron deficiency anemia, unspecified: Secondary | ICD-10-CM | POA: Diagnosis not present

## 2017-07-23 DIAGNOSIS — M6281 Muscle weakness (generalized): Secondary | ICD-10-CM | POA: Diagnosis not present

## 2017-08-05 DIAGNOSIS — M1990 Unspecified osteoarthritis, unspecified site: Secondary | ICD-10-CM | POA: Diagnosis not present

## 2017-08-05 DIAGNOSIS — M25569 Pain in unspecified knee: Secondary | ICD-10-CM | POA: Diagnosis not present

## 2017-08-15 DIAGNOSIS — F0150 Vascular dementia without behavioral disturbance: Secondary | ICD-10-CM | POA: Diagnosis not present

## 2017-08-15 DIAGNOSIS — F339 Major depressive disorder, recurrent, unspecified: Secondary | ICD-10-CM | POA: Diagnosis not present

## 2017-08-15 DIAGNOSIS — F419 Anxiety disorder, unspecified: Secondary | ICD-10-CM | POA: Diagnosis not present

## 2017-08-20 DIAGNOSIS — R609 Edema, unspecified: Secondary | ICD-10-CM | POA: Diagnosis not present

## 2017-08-20 DIAGNOSIS — E539 Vitamin B deficiency, unspecified: Secondary | ICD-10-CM | POA: Diagnosis not present

## 2017-08-20 DIAGNOSIS — K5900 Constipation, unspecified: Secondary | ICD-10-CM | POA: Diagnosis not present

## 2017-08-20 DIAGNOSIS — E785 Hyperlipidemia, unspecified: Secondary | ICD-10-CM | POA: Diagnosis not present

## 2017-08-27 DIAGNOSIS — R609 Edema, unspecified: Secondary | ICD-10-CM | POA: Diagnosis not present

## 2017-08-27 DIAGNOSIS — K5900 Constipation, unspecified: Secondary | ICD-10-CM | POA: Diagnosis not present

## 2017-08-27 DIAGNOSIS — M6281 Muscle weakness (generalized): Secondary | ICD-10-CM | POA: Diagnosis not present

## 2017-08-27 DIAGNOSIS — E785 Hyperlipidemia, unspecified: Secondary | ICD-10-CM | POA: Diagnosis not present

## 2017-09-03 DIAGNOSIS — M1990 Unspecified osteoarthritis, unspecified site: Secondary | ICD-10-CM | POA: Diagnosis not present

## 2017-09-03 DIAGNOSIS — G894 Chronic pain syndrome: Secondary | ICD-10-CM | POA: Diagnosis not present

## 2017-09-17 DIAGNOSIS — Q845 Enlarged and hypertrophic nails: Secondary | ICD-10-CM | POA: Diagnosis not present

## 2017-09-17 DIAGNOSIS — B351 Tinea unguium: Secondary | ICD-10-CM | POA: Diagnosis not present

## 2017-09-17 DIAGNOSIS — I739 Peripheral vascular disease, unspecified: Secondary | ICD-10-CM | POA: Diagnosis not present

## 2017-09-17 DIAGNOSIS — L603 Nail dystrophy: Secondary | ICD-10-CM | POA: Diagnosis not present

## 2017-09-18 DIAGNOSIS — I10 Essential (primary) hypertension: Secondary | ICD-10-CM | POA: Diagnosis not present

## 2017-09-18 DIAGNOSIS — N181 Chronic kidney disease, stage 1: Secondary | ICD-10-CM | POA: Diagnosis not present

## 2017-09-18 DIAGNOSIS — M6281 Muscle weakness (generalized): Secondary | ICD-10-CM | POA: Diagnosis not present

## 2017-09-18 DIAGNOSIS — E539 Vitamin B deficiency, unspecified: Secondary | ICD-10-CM | POA: Diagnosis not present

## 2017-10-03 DIAGNOSIS — M1990 Unspecified osteoarthritis, unspecified site: Secondary | ICD-10-CM | POA: Diagnosis not present

## 2017-10-03 DIAGNOSIS — G894 Chronic pain syndrome: Secondary | ICD-10-CM | POA: Diagnosis not present

## 2017-10-03 DIAGNOSIS — M25569 Pain in unspecified knee: Secondary | ICD-10-CM | POA: Diagnosis not present

## 2017-10-07 DIAGNOSIS — D649 Anemia, unspecified: Secondary | ICD-10-CM | POA: Diagnosis not present

## 2017-10-07 DIAGNOSIS — I10 Essential (primary) hypertension: Secondary | ICD-10-CM | POA: Diagnosis not present

## 2017-10-16 DIAGNOSIS — N181 Chronic kidney disease, stage 1: Secondary | ICD-10-CM | POA: Diagnosis not present

## 2017-10-16 DIAGNOSIS — R54 Age-related physical debility: Secondary | ICD-10-CM | POA: Diagnosis not present

## 2017-10-16 DIAGNOSIS — E785 Hyperlipidemia, unspecified: Secondary | ICD-10-CM | POA: Diagnosis not present

## 2017-10-16 DIAGNOSIS — I10 Essential (primary) hypertension: Secondary | ICD-10-CM | POA: Diagnosis not present

## 2017-10-30 DIAGNOSIS — G301 Alzheimer's disease with late onset: Secondary | ICD-10-CM | POA: Diagnosis not present

## 2017-10-30 DIAGNOSIS — F330 Major depressive disorder, recurrent, mild: Secondary | ICD-10-CM | POA: Diagnosis not present

## 2017-10-30 DIAGNOSIS — F0280 Dementia in other diseases classified elsewhere without behavioral disturbance: Secondary | ICD-10-CM | POA: Diagnosis not present

## 2017-11-06 DIAGNOSIS — G894 Chronic pain syndrome: Secondary | ICD-10-CM | POA: Diagnosis not present

## 2017-11-06 DIAGNOSIS — M1990 Unspecified osteoarthritis, unspecified site: Secondary | ICD-10-CM | POA: Diagnosis not present

## 2017-11-06 DIAGNOSIS — M25562 Pain in left knee: Secondary | ICD-10-CM | POA: Diagnosis not present

## 2017-11-18 DIAGNOSIS — M6281 Muscle weakness (generalized): Secondary | ICD-10-CM | POA: Diagnosis not present

## 2017-11-18 DIAGNOSIS — H3530 Unspecified macular degeneration: Secondary | ICD-10-CM | POA: Diagnosis not present

## 2017-11-18 DIAGNOSIS — K5900 Constipation, unspecified: Secondary | ICD-10-CM | POA: Diagnosis not present

## 2017-11-18 DIAGNOSIS — R54 Age-related physical debility: Secondary | ICD-10-CM | POA: Diagnosis not present

## 2017-11-27 DIAGNOSIS — G301 Alzheimer's disease with late onset: Secondary | ICD-10-CM | POA: Diagnosis not present

## 2017-11-27 DIAGNOSIS — F330 Major depressive disorder, recurrent, mild: Secondary | ICD-10-CM | POA: Diagnosis not present

## 2017-11-27 DIAGNOSIS — F0280 Dementia in other diseases classified elsewhere without behavioral disturbance: Secondary | ICD-10-CM | POA: Diagnosis not present

## 2017-12-11 DIAGNOSIS — M1990 Unspecified osteoarthritis, unspecified site: Secondary | ICD-10-CM | POA: Diagnosis not present

## 2017-12-11 DIAGNOSIS — G894 Chronic pain syndrome: Secondary | ICD-10-CM | POA: Diagnosis not present

## 2017-12-25 DIAGNOSIS — G301 Alzheimer's disease with late onset: Secondary | ICD-10-CM | POA: Diagnosis not present

## 2017-12-25 DIAGNOSIS — F330 Major depressive disorder, recurrent, mild: Secondary | ICD-10-CM | POA: Diagnosis not present

## 2017-12-25 DIAGNOSIS — F0280 Dementia in other diseases classified elsewhere without behavioral disturbance: Secondary | ICD-10-CM | POA: Diagnosis not present

## 2017-12-26 DIAGNOSIS — Q845 Enlarged and hypertrophic nails: Secondary | ICD-10-CM | POA: Diagnosis not present

## 2017-12-26 DIAGNOSIS — B351 Tinea unguium: Secondary | ICD-10-CM | POA: Diagnosis not present

## 2017-12-26 DIAGNOSIS — I739 Peripheral vascular disease, unspecified: Secondary | ICD-10-CM | POA: Diagnosis not present

## 2017-12-26 DIAGNOSIS — L603 Nail dystrophy: Secondary | ICD-10-CM | POA: Diagnosis not present

## 2017-12-27 DIAGNOSIS — R54 Age-related physical debility: Secondary | ICD-10-CM | POA: Diagnosis not present

## 2017-12-27 DIAGNOSIS — N181 Chronic kidney disease, stage 1: Secondary | ICD-10-CM | POA: Diagnosis not present

## 2017-12-27 DIAGNOSIS — M6281 Muscle weakness (generalized): Secondary | ICD-10-CM | POA: Diagnosis not present

## 2017-12-27 DIAGNOSIS — I10 Essential (primary) hypertension: Secondary | ICD-10-CM | POA: Diagnosis not present

## 2018-01-15 DIAGNOSIS — M1990 Unspecified osteoarthritis, unspecified site: Secondary | ICD-10-CM | POA: Diagnosis not present

## 2018-01-15 DIAGNOSIS — G894 Chronic pain syndrome: Secondary | ICD-10-CM | POA: Diagnosis not present

## 2018-01-23 DIAGNOSIS — E785 Hyperlipidemia, unspecified: Secondary | ICD-10-CM | POA: Diagnosis not present

## 2018-01-23 DIAGNOSIS — D509 Iron deficiency anemia, unspecified: Secondary | ICD-10-CM | POA: Diagnosis not present

## 2018-01-23 DIAGNOSIS — R634 Abnormal weight loss: Secondary | ICD-10-CM | POA: Diagnosis not present

## 2018-01-23 DIAGNOSIS — H3530 Unspecified macular degeneration: Secondary | ICD-10-CM | POA: Diagnosis not present

## 2018-01-27 DIAGNOSIS — D649 Anemia, unspecified: Secondary | ICD-10-CM | POA: Diagnosis not present

## 2018-01-27 DIAGNOSIS — Z79899 Other long term (current) drug therapy: Secondary | ICD-10-CM | POA: Diagnosis not present

## 2018-01-29 DIAGNOSIS — F330 Major depressive disorder, recurrent, mild: Secondary | ICD-10-CM | POA: Diagnosis not present

## 2018-01-29 DIAGNOSIS — G301 Alzheimer's disease with late onset: Secondary | ICD-10-CM | POA: Diagnosis not present

## 2018-01-29 DIAGNOSIS — F0280 Dementia in other diseases classified elsewhere without behavioral disturbance: Secondary | ICD-10-CM | POA: Diagnosis not present

## 2018-02-19 DIAGNOSIS — M1990 Unspecified osteoarthritis, unspecified site: Secondary | ICD-10-CM | POA: Diagnosis not present

## 2018-02-19 DIAGNOSIS — G894 Chronic pain syndrome: Secondary | ICD-10-CM | POA: Diagnosis not present

## 2018-02-26 DIAGNOSIS — G301 Alzheimer's disease with late onset: Secondary | ICD-10-CM | POA: Diagnosis not present

## 2018-02-26 DIAGNOSIS — L603 Nail dystrophy: Secondary | ICD-10-CM | POA: Diagnosis not present

## 2018-02-26 DIAGNOSIS — Q845 Enlarged and hypertrophic nails: Secondary | ICD-10-CM | POA: Diagnosis not present

## 2018-02-26 DIAGNOSIS — B351 Tinea unguium: Secondary | ICD-10-CM | POA: Diagnosis not present

## 2018-02-26 DIAGNOSIS — F0280 Dementia in other diseases classified elsewhere without behavioral disturbance: Secondary | ICD-10-CM | POA: Diagnosis not present

## 2018-02-26 DIAGNOSIS — F330 Major depressive disorder, recurrent, mild: Secondary | ICD-10-CM | POA: Diagnosis not present

## 2018-02-26 DIAGNOSIS — I739 Peripheral vascular disease, unspecified: Secondary | ICD-10-CM | POA: Diagnosis not present

## 2018-04-09 DIAGNOSIS — F330 Major depressive disorder, recurrent, mild: Secondary | ICD-10-CM | POA: Diagnosis not present

## 2018-04-09 DIAGNOSIS — G301 Alzheimer's disease with late onset: Secondary | ICD-10-CM | POA: Diagnosis not present

## 2018-04-09 DIAGNOSIS — F0280 Dementia in other diseases classified elsewhere without behavioral disturbance: Secondary | ICD-10-CM | POA: Diagnosis not present

## 2018-04-23 DIAGNOSIS — G301 Alzheimer's disease with late onset: Secondary | ICD-10-CM | POA: Diagnosis not present

## 2018-04-23 DIAGNOSIS — F0280 Dementia in other diseases classified elsewhere without behavioral disturbance: Secondary | ICD-10-CM | POA: Diagnosis not present

## 2018-04-23 DIAGNOSIS — F330 Major depressive disorder, recurrent, mild: Secondary | ICD-10-CM | POA: Diagnosis not present

## 2018-04-30 DIAGNOSIS — Q845 Enlarged and hypertrophic nails: Secondary | ICD-10-CM | POA: Diagnosis not present

## 2018-04-30 DIAGNOSIS — L603 Nail dystrophy: Secondary | ICD-10-CM | POA: Diagnosis not present

## 2018-04-30 DIAGNOSIS — I739 Peripheral vascular disease, unspecified: Secondary | ICD-10-CM | POA: Diagnosis not present

## 2018-04-30 DIAGNOSIS — B351 Tinea unguium: Secondary | ICD-10-CM | POA: Diagnosis not present

## 2018-05-21 DIAGNOSIS — M1990 Unspecified osteoarthritis, unspecified site: Secondary | ICD-10-CM | POA: Diagnosis not present

## 2018-05-21 DIAGNOSIS — G894 Chronic pain syndrome: Secondary | ICD-10-CM | POA: Diagnosis not present

## 2018-05-22 DIAGNOSIS — M6281 Muscle weakness (generalized): Secondary | ICD-10-CM | POA: Diagnosis not present

## 2018-05-22 DIAGNOSIS — D509 Iron deficiency anemia, unspecified: Secondary | ICD-10-CM | POA: Diagnosis not present

## 2018-05-22 DIAGNOSIS — E785 Hyperlipidemia, unspecified: Secondary | ICD-10-CM | POA: Diagnosis not present

## 2018-05-22 DIAGNOSIS — R634 Abnormal weight loss: Secondary | ICD-10-CM | POA: Diagnosis not present

## 2018-05-28 DIAGNOSIS — F0280 Dementia in other diseases classified elsewhere without behavioral disturbance: Secondary | ICD-10-CM | POA: Diagnosis not present

## 2018-05-28 DIAGNOSIS — G301 Alzheimer's disease with late onset: Secondary | ICD-10-CM | POA: Diagnosis not present

## 2018-06-25 DIAGNOSIS — G301 Alzheimer's disease with late onset: Secondary | ICD-10-CM | POA: Diagnosis not present

## 2018-06-25 DIAGNOSIS — F0280 Dementia in other diseases classified elsewhere without behavioral disturbance: Secondary | ICD-10-CM | POA: Diagnosis not present

## 2018-07-02 DIAGNOSIS — Z0000 Encounter for general adult medical examination without abnormal findings: Secondary | ICD-10-CM | POA: Diagnosis not present

## 2018-07-02 DIAGNOSIS — I10 Essential (primary) hypertension: Secondary | ICD-10-CM | POA: Diagnosis not present

## 2018-07-17 DIAGNOSIS — H3530 Unspecified macular degeneration: Secondary | ICD-10-CM | POA: Diagnosis not present

## 2018-07-17 DIAGNOSIS — R54 Age-related physical debility: Secondary | ICD-10-CM | POA: Diagnosis not present

## 2018-07-17 DIAGNOSIS — D509 Iron deficiency anemia, unspecified: Secondary | ICD-10-CM | POA: Diagnosis not present

## 2018-07-17 DIAGNOSIS — R634 Abnormal weight loss: Secondary | ICD-10-CM | POA: Diagnosis not present

## 2018-07-19 DIAGNOSIS — M1990 Unspecified osteoarthritis, unspecified site: Secondary | ICD-10-CM | POA: Diagnosis not present

## 2018-07-19 DIAGNOSIS — R634 Abnormal weight loss: Secondary | ICD-10-CM | POA: Diagnosis not present

## 2018-07-19 DIAGNOSIS — G894 Chronic pain syndrome: Secondary | ICD-10-CM | POA: Diagnosis not present

## 2018-07-23 DIAGNOSIS — F0280 Dementia in other diseases classified elsewhere without behavioral disturbance: Secondary | ICD-10-CM | POA: Diagnosis not present

## 2018-07-23 DIAGNOSIS — G301 Alzheimer's disease with late onset: Secondary | ICD-10-CM | POA: Diagnosis not present

## 2018-08-04 DIAGNOSIS — R54 Age-related physical debility: Secondary | ICD-10-CM | POA: Diagnosis not present

## 2018-08-04 DIAGNOSIS — D509 Iron deficiency anemia, unspecified: Secondary | ICD-10-CM | POA: Diagnosis not present

## 2018-08-04 DIAGNOSIS — R634 Abnormal weight loss: Secondary | ICD-10-CM | POA: Diagnosis not present

## 2018-08-04 DIAGNOSIS — H3530 Unspecified macular degeneration: Secondary | ICD-10-CM | POA: Diagnosis not present

## 2018-08-05 DIAGNOSIS — M25562 Pain in left knee: Secondary | ICD-10-CM | POA: Diagnosis not present

## 2018-08-05 DIAGNOSIS — I10 Essential (primary) hypertension: Secondary | ICD-10-CM | POA: Diagnosis not present

## 2018-08-05 DIAGNOSIS — Z96652 Presence of left artificial knee joint: Secondary | ICD-10-CM | POA: Diagnosis not present

## 2018-08-05 DIAGNOSIS — D649 Anemia, unspecified: Secondary | ICD-10-CM | POA: Diagnosis not present

## 2018-09-16 DIAGNOSIS — D509 Iron deficiency anemia, unspecified: Secondary | ICD-10-CM | POA: Diagnosis not present

## 2018-09-16 DIAGNOSIS — R54 Age-related physical debility: Secondary | ICD-10-CM | POA: Diagnosis not present

## 2018-09-16 DIAGNOSIS — H3530 Unspecified macular degeneration: Secondary | ICD-10-CM | POA: Diagnosis not present

## 2018-09-16 DIAGNOSIS — R634 Abnormal weight loss: Secondary | ICD-10-CM | POA: Diagnosis not present

## 2018-09-24 DIAGNOSIS — F0280 Dementia in other diseases classified elsewhere without behavioral disturbance: Secondary | ICD-10-CM | POA: Diagnosis not present

## 2018-09-24 DIAGNOSIS — G301 Alzheimer's disease with late onset: Secondary | ICD-10-CM | POA: Diagnosis not present

## 2018-09-24 DIAGNOSIS — Z79899 Other long term (current) drug therapy: Secondary | ICD-10-CM | POA: Diagnosis not present

## 2018-09-25 DIAGNOSIS — N390 Urinary tract infection, site not specified: Secondary | ICD-10-CM | POA: Diagnosis not present

## 2018-09-25 DIAGNOSIS — R319 Hematuria, unspecified: Secondary | ICD-10-CM | POA: Diagnosis not present

## 2018-09-25 DIAGNOSIS — Z79899 Other long term (current) drug therapy: Secondary | ICD-10-CM | POA: Diagnosis not present

## 2018-10-16 DIAGNOSIS — R296 Repeated falls: Secondary | ICD-10-CM | POA: Diagnosis not present

## 2018-10-17 DIAGNOSIS — K5900 Constipation, unspecified: Secondary | ICD-10-CM | POA: Diagnosis not present

## 2018-10-17 DIAGNOSIS — R634 Abnormal weight loss: Secondary | ICD-10-CM | POA: Diagnosis not present

## 2018-10-17 DIAGNOSIS — H3530 Unspecified macular degeneration: Secondary | ICD-10-CM | POA: Diagnosis not present

## 2018-10-17 DIAGNOSIS — R54 Age-related physical debility: Secondary | ICD-10-CM | POA: Diagnosis not present

## 2018-11-18 DIAGNOSIS — R54 Age-related physical debility: Secondary | ICD-10-CM | POA: Diagnosis not present

## 2018-11-18 DIAGNOSIS — Z741 Need for assistance with personal care: Secondary | ICD-10-CM | POA: Diagnosis not present

## 2018-11-18 DIAGNOSIS — R2689 Other abnormalities of gait and mobility: Secondary | ICD-10-CM | POA: Diagnosis not present

## 2018-11-18 DIAGNOSIS — H3530 Unspecified macular degeneration: Secondary | ICD-10-CM | POA: Diagnosis not present

## 2018-11-28 DIAGNOSIS — R54 Age-related physical debility: Secondary | ICD-10-CM | POA: Diagnosis not present

## 2018-12-15 DIAGNOSIS — R634 Abnormal weight loss: Secondary | ICD-10-CM | POA: Diagnosis not present

## 2018-12-15 DIAGNOSIS — G894 Chronic pain syndrome: Secondary | ICD-10-CM | POA: Diagnosis not present

## 2018-12-15 DIAGNOSIS — M1990 Unspecified osteoarthritis, unspecified site: Secondary | ICD-10-CM | POA: Diagnosis not present

## 2018-12-19 DIAGNOSIS — R2689 Other abnormalities of gait and mobility: Secondary | ICD-10-CM | POA: Diagnosis not present

## 2018-12-19 DIAGNOSIS — H3530 Unspecified macular degeneration: Secondary | ICD-10-CM | POA: Diagnosis not present

## 2018-12-19 DIAGNOSIS — R54 Age-related physical debility: Secondary | ICD-10-CM | POA: Diagnosis not present

## 2018-12-19 DIAGNOSIS — Z741 Need for assistance with personal care: Secondary | ICD-10-CM | POA: Diagnosis not present

## 2019-01-12 DIAGNOSIS — G8929 Other chronic pain: Secondary | ICD-10-CM | POA: Diagnosis not present

## 2019-01-13 DIAGNOSIS — Z96652 Presence of left artificial knee joint: Secondary | ICD-10-CM | POA: Diagnosis not present

## 2019-01-13 DIAGNOSIS — M25562 Pain in left knee: Secondary | ICD-10-CM | POA: Diagnosis not present

## 2019-01-14 DIAGNOSIS — M1711 Unilateral primary osteoarthritis, right knee: Secondary | ICD-10-CM | POA: Diagnosis not present

## 2019-02-02 DIAGNOSIS — K5900 Constipation, unspecified: Secondary | ICD-10-CM | POA: Diagnosis not present

## 2019-02-02 DIAGNOSIS — E559 Vitamin D deficiency, unspecified: Secondary | ICD-10-CM | POA: Diagnosis not present

## 2019-02-02 DIAGNOSIS — G8929 Other chronic pain: Secondary | ICD-10-CM | POA: Diagnosis not present

## 2019-02-02 DIAGNOSIS — I10 Essential (primary) hypertension: Secondary | ICD-10-CM | POA: Diagnosis not present

## 2019-02-03 DIAGNOSIS — R54 Age-related physical debility: Secondary | ICD-10-CM | POA: Diagnosis not present

## 2019-02-03 DIAGNOSIS — I10 Essential (primary) hypertension: Secondary | ICD-10-CM | POA: Diagnosis not present

## 2019-02-03 DIAGNOSIS — D649 Anemia, unspecified: Secondary | ICD-10-CM | POA: Diagnosis not present

## 2019-02-03 DIAGNOSIS — G8929 Other chronic pain: Secondary | ICD-10-CM | POA: Diagnosis not present

## 2019-02-12 DIAGNOSIS — Z03818 Encounter for observation for suspected exposure to other biological agents ruled out: Secondary | ICD-10-CM | POA: Diagnosis not present

## 2019-02-12 DIAGNOSIS — R7989 Other specified abnormal findings of blood chemistry: Secondary | ICD-10-CM | POA: Diagnosis not present

## 2019-02-12 DIAGNOSIS — I10 Essential (primary) hypertension: Secondary | ICD-10-CM | POA: Diagnosis not present

## 2019-02-12 DIAGNOSIS — D649 Anemia, unspecified: Secondary | ICD-10-CM | POA: Diagnosis not present

## 2019-02-12 DIAGNOSIS — Z79899 Other long term (current) drug therapy: Secondary | ICD-10-CM | POA: Diagnosis not present

## 2019-02-25 DIAGNOSIS — Z03818 Encounter for observation for suspected exposure to other biological agents ruled out: Secondary | ICD-10-CM | POA: Diagnosis not present

## 2019-03-03 DIAGNOSIS — Z03818 Encounter for observation for suspected exposure to other biological agents ruled out: Secondary | ICD-10-CM | POA: Diagnosis not present

## 2019-03-06 DIAGNOSIS — R319 Hematuria, unspecified: Secondary | ICD-10-CM | POA: Diagnosis not present

## 2019-03-06 DIAGNOSIS — N390 Urinary tract infection, site not specified: Secondary | ICD-10-CM | POA: Diagnosis not present

## 2019-03-06 DIAGNOSIS — R4182 Altered mental status, unspecified: Secondary | ICD-10-CM | POA: Diagnosis not present

## 2019-03-06 DIAGNOSIS — Z79899 Other long term (current) drug therapy: Secondary | ICD-10-CM | POA: Diagnosis not present

## 2019-03-06 DIAGNOSIS — N399 Disorder of urinary system, unspecified: Secondary | ICD-10-CM | POA: Diagnosis not present

## 2019-03-06 DIAGNOSIS — R32 Unspecified urinary incontinence: Secondary | ICD-10-CM | POA: Diagnosis not present

## 2019-03-11 DIAGNOSIS — N390 Urinary tract infection, site not specified: Secondary | ICD-10-CM | POA: Diagnosis not present

## 2019-03-16 DIAGNOSIS — R4182 Altered mental status, unspecified: Secondary | ICD-10-CM | POA: Diagnosis not present

## 2019-03-16 DIAGNOSIS — I10 Essential (primary) hypertension: Secondary | ICD-10-CM | POA: Diagnosis not present

## 2019-03-16 DIAGNOSIS — N399 Disorder of urinary system, unspecified: Secondary | ICD-10-CM | POA: Diagnosis not present

## 2019-03-16 DIAGNOSIS — G8929 Other chronic pain: Secondary | ICD-10-CM | POA: Diagnosis not present

## 2019-03-25 DIAGNOSIS — F0280 Dementia in other diseases classified elsewhere without behavioral disturbance: Secondary | ICD-10-CM | POA: Diagnosis not present

## 2019-03-25 DIAGNOSIS — G301 Alzheimer's disease with late onset: Secondary | ICD-10-CM | POA: Diagnosis not present

## 2019-03-26 DIAGNOSIS — R5381 Other malaise: Secondary | ICD-10-CM | POA: Diagnosis not present

## 2019-03-26 DIAGNOSIS — D649 Anemia, unspecified: Secondary | ICD-10-CM | POA: Diagnosis not present

## 2019-03-26 DIAGNOSIS — G8929 Other chronic pain: Secondary | ICD-10-CM | POA: Diagnosis not present

## 2019-03-26 DIAGNOSIS — G4710 Hypersomnia, unspecified: Secondary | ICD-10-CM | POA: Diagnosis not present

## 2019-04-01 DIAGNOSIS — Z1159 Encounter for screening for other viral diseases: Secondary | ICD-10-CM | POA: Diagnosis not present

## 2019-04-10 DIAGNOSIS — R634 Abnormal weight loss: Secondary | ICD-10-CM | POA: Diagnosis not present

## 2019-04-14 DIAGNOSIS — G8929 Other chronic pain: Secondary | ICD-10-CM | POA: Diagnosis not present

## 2019-04-14 DIAGNOSIS — R54 Age-related physical debility: Secondary | ICD-10-CM | POA: Diagnosis not present

## 2019-04-14 DIAGNOSIS — D638 Anemia in other chronic diseases classified elsewhere: Secondary | ICD-10-CM | POA: Diagnosis not present

## 2019-04-14 DIAGNOSIS — I10 Essential (primary) hypertension: Secondary | ICD-10-CM | POA: Diagnosis not present

## 2019-04-16 DIAGNOSIS — Z03818 Encounter for observation for suspected exposure to other biological agents ruled out: Secondary | ICD-10-CM | POA: Diagnosis not present

## 2019-04-23 DIAGNOSIS — R05 Cough: Secondary | ICD-10-CM | POA: Diagnosis not present

## 2019-04-24 DIAGNOSIS — G8929 Other chronic pain: Secondary | ICD-10-CM | POA: Diagnosis not present

## 2019-04-24 DIAGNOSIS — I10 Essential (primary) hypertension: Secondary | ICD-10-CM | POA: Diagnosis not present

## 2019-04-24 DIAGNOSIS — D638 Anemia in other chronic diseases classified elsewhere: Secondary | ICD-10-CM | POA: Diagnosis not present

## 2019-04-24 DIAGNOSIS — R54 Age-related physical debility: Secondary | ICD-10-CM | POA: Diagnosis not present

## 2019-05-06 DIAGNOSIS — F0280 Dementia in other diseases classified elsewhere without behavioral disturbance: Secondary | ICD-10-CM | POA: Diagnosis not present

## 2019-05-06 DIAGNOSIS — G301 Alzheimer's disease with late onset: Secondary | ICD-10-CM | POA: Diagnosis not present

## 2019-05-06 DIAGNOSIS — J40 Bronchitis, not specified as acute or chronic: Secondary | ICD-10-CM | POA: Diagnosis not present

## 2019-05-18 DIAGNOSIS — G8929 Other chronic pain: Secondary | ICD-10-CM | POA: Diagnosis not present

## 2019-05-19 DIAGNOSIS — Z1159 Encounter for screening for other viral diseases: Secondary | ICD-10-CM | POA: Diagnosis not present

## 2019-05-27 DIAGNOSIS — G301 Alzheimer's disease with late onset: Secondary | ICD-10-CM | POA: Diagnosis not present

## 2019-05-27 DIAGNOSIS — D638 Anemia in other chronic diseases classified elsewhere: Secondary | ICD-10-CM | POA: Diagnosis not present

## 2019-05-27 DIAGNOSIS — F331 Major depressive disorder, recurrent, moderate: Secondary | ICD-10-CM | POA: Diagnosis not present

## 2019-05-27 DIAGNOSIS — I10 Essential (primary) hypertension: Secondary | ICD-10-CM | POA: Diagnosis not present

## 2019-05-27 DIAGNOSIS — F0280 Dementia in other diseases classified elsewhere without behavioral disturbance: Secondary | ICD-10-CM | POA: Diagnosis not present

## 2019-05-27 DIAGNOSIS — R54 Age-related physical debility: Secondary | ICD-10-CM | POA: Diagnosis not present

## 2019-05-27 DIAGNOSIS — N182 Chronic kidney disease, stage 2 (mild): Secondary | ICD-10-CM | POA: Diagnosis not present

## 2019-06-09 DIAGNOSIS — Z1159 Encounter for screening for other viral diseases: Secondary | ICD-10-CM | POA: Diagnosis not present

## 2019-06-15 DIAGNOSIS — Z1159 Encounter for screening for other viral diseases: Secondary | ICD-10-CM | POA: Diagnosis not present

## 2019-06-16 DIAGNOSIS — D638 Anemia in other chronic diseases classified elsewhere: Secondary | ICD-10-CM | POA: Diagnosis not present

## 2019-06-16 DIAGNOSIS — G8929 Other chronic pain: Secondary | ICD-10-CM | POA: Diagnosis not present

## 2019-06-16 DIAGNOSIS — I10 Essential (primary) hypertension: Secondary | ICD-10-CM | POA: Diagnosis not present

## 2019-06-16 DIAGNOSIS — R54 Age-related physical debility: Secondary | ICD-10-CM | POA: Diagnosis not present

## 2019-06-19 DIAGNOSIS — Z1159 Encounter for screening for other viral diseases: Secondary | ICD-10-CM | POA: Diagnosis not present

## 2019-06-22 DIAGNOSIS — R0902 Hypoxemia: Secondary | ICD-10-CM | POA: Diagnosis not present

## 2019-06-22 DIAGNOSIS — R509 Fever, unspecified: Secondary | ICD-10-CM | POA: Diagnosis not present

## 2019-06-23 DIAGNOSIS — G8929 Other chronic pain: Secondary | ICD-10-CM | POA: Diagnosis not present

## 2019-06-23 DIAGNOSIS — R509 Fever, unspecified: Secondary | ICD-10-CM | POA: Diagnosis not present

## 2019-06-23 DIAGNOSIS — R0902 Hypoxemia: Secondary | ICD-10-CM | POA: Diagnosis not present

## 2019-06-25 DIAGNOSIS — N390 Urinary tract infection, site not specified: Secondary | ICD-10-CM | POA: Diagnosis not present

## 2019-06-26 DIAGNOSIS — R05 Cough: Secondary | ICD-10-CM | POA: Diagnosis not present

## 2019-06-26 DIAGNOSIS — U071 COVID-19: Secondary | ICD-10-CM | POA: Diagnosis not present

## 2019-06-29 DIAGNOSIS — D649 Anemia, unspecified: Secondary | ICD-10-CM | POA: Diagnosis not present

## 2019-06-29 DIAGNOSIS — R4182 Altered mental status, unspecified: Secondary | ICD-10-CM | POA: Diagnosis not present

## 2019-06-29 DIAGNOSIS — E7800 Pure hypercholesterolemia, unspecified: Secondary | ICD-10-CM | POA: Diagnosis not present

## 2019-06-29 DIAGNOSIS — U071 COVID-19: Secondary | ICD-10-CM | POA: Diagnosis not present

## 2019-07-01 DIAGNOSIS — D649 Anemia, unspecified: Secondary | ICD-10-CM | POA: Diagnosis not present

## 2019-07-01 DIAGNOSIS — U071 COVID-19: Secondary | ICD-10-CM | POA: Diagnosis not present

## 2019-07-02 DIAGNOSIS — U071 COVID-19: Secondary | ICD-10-CM | POA: Diagnosis not present

## 2019-07-02 DIAGNOSIS — D649 Anemia, unspecified: Secondary | ICD-10-CM | POA: Diagnosis not present

## 2019-07-06 DIAGNOSIS — U071 COVID-19: Secondary | ICD-10-CM | POA: Diagnosis not present

## 2019-07-06 DIAGNOSIS — D649 Anemia, unspecified: Secondary | ICD-10-CM | POA: Diagnosis not present

## 2019-07-07 DIAGNOSIS — D649 Anemia, unspecified: Secondary | ICD-10-CM | POA: Diagnosis not present

## 2019-07-07 DIAGNOSIS — R0902 Hypoxemia: Secondary | ICD-10-CM | POA: Diagnosis not present

## 2019-07-07 DIAGNOSIS — U071 COVID-19: Secondary | ICD-10-CM | POA: Diagnosis not present

## 2019-07-07 DIAGNOSIS — J159 Unspecified bacterial pneumonia: Secondary | ICD-10-CM | POA: Diagnosis not present

## 2019-07-08 DIAGNOSIS — G301 Alzheimer's disease with late onset: Secondary | ICD-10-CM | POA: Diagnosis not present

## 2019-07-08 DIAGNOSIS — F331 Major depressive disorder, recurrent, moderate: Secondary | ICD-10-CM | POA: Diagnosis not present

## 2019-07-08 DIAGNOSIS — F0280 Dementia in other diseases classified elsewhere without behavioral disturbance: Secondary | ICD-10-CM | POA: Diagnosis not present

## 2019-07-09 DIAGNOSIS — D649 Anemia, unspecified: Secondary | ICD-10-CM | POA: Diagnosis not present

## 2019-07-09 DIAGNOSIS — Z79899 Other long term (current) drug therapy: Secondary | ICD-10-CM | POA: Diagnosis not present

## 2019-07-10 DIAGNOSIS — R064 Hyperventilation: Secondary | ICD-10-CM | POA: Diagnosis not present

## 2019-07-13 DIAGNOSIS — J189 Pneumonia, unspecified organism: Secondary | ICD-10-CM | POA: Diagnosis not present

## 2019-07-15 DIAGNOSIS — U071 COVID-19: Secondary | ICD-10-CM | POA: Diagnosis not present

## 2019-07-17 DIAGNOSIS — R451 Restlessness and agitation: Secondary | ICD-10-CM | POA: Diagnosis not present

## 2019-08-17 DEATH — deceased
